# Patient Record
Sex: Female | Born: 1965 | Race: White | Hispanic: No | Marital: Married | Smoking: Current every day smoker
Health system: Southern US, Community
[De-identification: ages and names within clinical notes are randomized; demographics above are authoritative.]

## PROBLEM LIST (undated history)

## (undated) ENCOUNTER — Ambulatory Visit: Discharge status: Absent without leave | Payer: BC Managed Care – PPO

## (undated) DIAGNOSIS — B019 Varicella without complication: Secondary | ICD-10-CM

## (undated) HISTORY — DX: Varicella without complication: B01.9

## (undated) HISTORY — PX: FINGER SURGERY: SHX640

---
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
## 1998-10-15 ENCOUNTER — Encounter: Payer: Self-pay | Admitting: Emergency Medicine

## 1998-10-15 ENCOUNTER — Emergency Department (HOSPITAL_COMMUNITY): Admission: EM | Admit: 1998-10-15 | Discharge: 1998-10-15 | Payer: Self-pay | Admitting: Emergency Medicine

## 1999-07-25 ENCOUNTER — Other Ambulatory Visit: Admission: RE | Admit: 1999-07-25 | Discharge: 1999-07-25 | Payer: Self-pay | Admitting: Obstetrics and Gynecology

## 1999-07-26 ENCOUNTER — Encounter: Payer: Self-pay | Admitting: Emergency Medicine

## 1999-07-26 ENCOUNTER — Emergency Department (HOSPITAL_COMMUNITY): Admission: EM | Admit: 1999-07-26 | Discharge: 1999-07-26 | Payer: Self-pay | Admitting: Emergency Medicine

## 1999-07-31 ENCOUNTER — Ambulatory Visit (HOSPITAL_BASED_OUTPATIENT_CLINIC_OR_DEPARTMENT_OTHER): Admission: RE | Admit: 1999-07-31 | Discharge: 1999-07-31 | Payer: Self-pay | Admitting: Orthopedic Surgery

## 1999-11-29 ENCOUNTER — Ambulatory Visit (HOSPITAL_COMMUNITY)
Admission: RE | Admit: 1999-11-29 | Discharge: 1999-11-29 | Payer: No Typology Code available for payment source | Admitting: Obstetrics & Gynecology

## 2000-03-04 ENCOUNTER — Inpatient Hospital Stay (HOSPITAL_COMMUNITY): Admission: AD | Admit: 2000-03-04 | Discharge: 2000-03-07 | Payer: Self-pay | Admitting: Obstetrics and Gynecology

## 2001-10-13 ENCOUNTER — Other Ambulatory Visit: Admission: RE | Admit: 2001-10-13 | Discharge: 2001-10-13 | Payer: Self-pay | Admitting: Obstetrics and Gynecology

## 2003-04-19 ENCOUNTER — Other Ambulatory Visit: Admission: RE | Admit: 2003-04-19 | Discharge: 2003-04-19 | Payer: Self-pay | Admitting: Obstetrics and Gynecology

## 2004-08-01 ENCOUNTER — Other Ambulatory Visit: Admission: RE | Admit: 2004-08-01 | Discharge: 2004-08-01 | Payer: Self-pay | Admitting: Obstetrics and Gynecology

## 2005-10-27 ENCOUNTER — Other Ambulatory Visit: Admission: RE | Admit: 2005-10-27 | Discharge: 2005-10-27 | Payer: Self-pay | Admitting: Obstetrics and Gynecology

## 2009-07-11 ENCOUNTER — Emergency Department (HOSPITAL_COMMUNITY): Admission: EM | Admit: 2009-07-11 | Discharge: 2009-07-11 | Payer: Self-pay | Admitting: Emergency Medicine

## 2010-12-26 LAB — COMPREHENSIVE METABOLIC PANEL
ALT: 12 U/L (ref 0–35)
Alkaline Phosphatase: 57 U/L (ref 39–117)
CO2: 27 mEq/L (ref 19–32)
Calcium: 8.6 mg/dL (ref 8.4–10.5)
Chloride: 110 mEq/L (ref 96–112)
GFR calc non Af Amer: >60 mL/min (ref >60)
Glucose, Bld: 74 mg/dL (ref 70–99)
Potassium: 4.5 mEq/L (ref 3.5–5.1)
Sodium: 140 mEq/L (ref 135–145)
Total Bilirubin: 0.4 mg/dL (ref 0.3–1.2)

## 2010-12-26 LAB — LIPASE, BLOOD: Lipase: 29 U/L (ref 11–59)

## 2010-12-26 LAB — URINALYSIS, ROUTINE W REFLEX MICROSCOPIC
Bilirubin Urine: NEGATIVE
Glucose, UA: NEGATIVE mg/dL
Hgb urine dipstick: NEGATIVE
Ketones, ur: NEGATIVE mg/dL
Nitrite: NEGATIVE
Protein, ur: NEGATIVE mg/dL
Specific Gravity, Urine: 1.009 (ref 1.005–1.030)
Urobilinogen, UA: 0.2 mg/dL (ref 0.0–1.0)
pH: 7 (ref 5.0–8.0)

## 2010-12-26 LAB — DIFFERENTIAL
Basophils Relative: 1 % (ref 0–1)
Eosinophils Absolute: 0.1 10*3/uL (ref 0.0–0.7)
Neutrophils Relative %: 58 % (ref 43–77)

## 2010-12-26 LAB — CBC
Hemoglobin: 13 g/dL (ref 12.0–15.0)
MCHC: 34.8 g/dL (ref 30.0–36.0)
RBC: 4.01 MIL/uL (ref 3.87–5.11)

## 2010-12-26 LAB — PREGNANCY, URINE: Preg Test, Ur: NEGATIVE

## 2011-02-07 NOTE — Discharge Summary (Signed)
[facility]  Patient:    Diana Bruce, Diana Bruce                      MRN: [id number] Adm. Date:  [id number] Disc. Date: [id number] Attending:  Marcelle Overlie Dictator:   Leilani Able, P.A.                           Discharge Summary  FINAL DIAGNOSES:              1. Intrauterine pregnancy at 41-2/[redacted] weeks gestation.                               2. Nonreassuring fetal heart rate pattern.                               3. Inability of fetus to tolerate labor.                               4. Thick meconium stained fluid.  PROCEDURE:                    Primary low transverse cesarean section.  SURGEON:                      Marcelle Overlie, M.D.  COMPLICATIONS:                None.  HISTORY OF PRESENT ILLNESS:   This 45 year old, gravida 4, para 2, presents around 41-1/[redacted] weeks gestation complaining of rupture of membranes.  When the patient was put on the monitor, there were some variable decellerations noted. Amnioinfusion was begun secondary to the variables and the patient was of course admitted at his time. Thick meconium was noted with amnioinfusion and did not improve with amnioinfusion.  The patient still with some variables with late return. Tracing was discussed with the patient at bedside and decided to proceed with a cesarean section secondary to a persistent nonreassuring fetal heart rate tracing.  HOSPITAL COURSE:              The patient was taken to the operating room on March 05, 2000, by Marcelle Overlie, M.D. where a primary low transverse cesarean section was performed with the delivery of a 6 pound 2 ounce female infant with Apgars f 8 and 9.  The delivery went without complications.  The patients postoperative course was benign without significant fevers.  The patient was felt ready for discharge on postoperative day #2.  She was sent home on a regular diet, told to decrease activities, told to continue prenatal vitamins, and  FeSO4.  She was given Tylox one to two every four hours as needed for pain.  She was also given Motrin 600 mg one every six hours as needed for pain and told to follow up in the office in four weeks.  DISCHARGE LABORATORY DATA:    The patients hemoglobin was 10.1, white blood cell count 9.7. DD:  04/01/00 TD:  04/01/00 Job: [id number]

## 2011-02-07 NOTE — Op Note (Signed)
[facility]  Patient:    MARVENE, STROHM                      MRN: [id number] Proc. Date: 03/05/00 Adm. Date:  [id number] Attending:  Marcelle Overlie                           Operative Report  PREOPERATIVE DIAGNOSIS:       Intrauterine pregnancy at 41-2/7 weeks. Nonreassuring fetal heart rate pattern.  Inability of fetus to tolerate labor.  Thick meconium stained fluid.  POSTOPERATIVE DIAGNOSIS:      Intrauterine pregnancy at 41-2/7 weeks. Nonreassuring fetal heart rate pattern.  Inability of fetus to tolerate labor.  Thick meconium stained fluid.  OPERATION:                    Primary low transverse cesarean section.  SURGEON:                      Marcelle Overlie, M.D.  ASSISTANT:  ANESTHESIA:  ESTIMATED BLOOD LOSS:         500 cc.  FINDINGS:                     Female infant with thick meconium stained skin and fingernails, and placenta.  Apgars 8 at one minute and 9 at five minutes.  Body  cord x 1 with a nuchal cord x 1 and weight of 6 pounds 2 ounces.  COMPLICATIONS:                None.  DESCRIPTION OF PROCEDURE:     The patient was taken to the operating room and a  spinal was then placed.  A Foley was already placed in the bladder in the labor and delivery.  The abdomen was then prepped and draped in the usual sterile fashion.  A scalpel was used to make a small low transverse incision which was then carried  down to the fascia using electrocautery with good hemostasis.  The fascia was scored in the midline and extended laterally using Mayo scissors.  A Pfannenstiel incision was then created.  The rectus muscles were separated and the peritoneum was then entered and the incision was then extended.  The bladder blade was inserted and the lower uterine segment was identified.  The bladder flap was created sharply and digitally and then the bladder blade was then readjusted.  low transverse incision was made with the  scalpel and then extended laterally.  Upon entry into the uterine cavity, there was noted to be thick meconium.  The aby was in cephalic presentation and was delivered without difficulty.  It was noted that the baby was meconium stained and was a dark green color.  The babys mouth  and nose were DeLee suctioned on the perineum and was lightly meconium stained, but watery fluid which was obtained.  There was a loose nuchal cord x 1 as well as  loose body cord.  The cord was very thin as well.  The baby was delivered without difficulty.  The cord was clamped and cut.  The placenta was then manually removed and noted to be intact.  The uterus was cleared of all clots and debris.  The uterine incision was closed in a single layer using 0 chromic in a continuous running locked stitch.  This was noted to be hemostatic.  The peritoneum  was closed in a single layer using 0 Vicryl stitch in a continuous running stitch and the fascia was closed using 0 Vicryl in a continuous running stitch x 2 starting at  each corner and meeting in the midline.  After irrigation of the subcutaneous layers, the skin was closed with staples.  All sponge, needle, and instrument counts were correct x 2.  The patient tolerated the procedure well and went to he recovery room in stable condition. DD:  03/05/00 TD:  03/08/00 Job: [id number]

## 2014-02-24 ENCOUNTER — Ambulatory Visit (INDEPENDENT_AMBULATORY_CARE_PROVIDER_SITE_OTHER): Payer: No Typology Code available for payment source | Admitting: Internal Medicine

## 2014-02-24 ENCOUNTER — Encounter (INDEPENDENT_AMBULATORY_CARE_PROVIDER_SITE_OTHER): Payer: Self-pay

## 2014-02-24 ENCOUNTER — Encounter: Payer: Self-pay | Admitting: Internal Medicine

## 2014-02-24 ENCOUNTER — Other Ambulatory Visit (HOSPITAL_COMMUNITY)
Admission: RE | Admit: 2014-02-24 | Discharge: 2014-02-24 | Disposition: A | Discharge status: Home / Self Care | Payer: No Typology Code available for payment source | Source: Ambulatory Visit | Attending: Internal Medicine | Admitting: Internal Medicine

## 2014-02-24 VITALS — BP 98/64 | HR 78 | Temp 98.0°F | Ht 63.0 in | Wt 130.0 lb

## 2014-02-24 DIAGNOSIS — Z1239 Encounter for other screening for malignant neoplasm of breast: Secondary | ICD-10-CM | POA: Diagnosis not present

## 2014-02-24 DIAGNOSIS — Z0000 Encounter for general adult medical examination without abnormal findings: Secondary | ICD-10-CM | POA: Diagnosis not present

## 2014-02-24 DIAGNOSIS — B379 Candidiasis, unspecified: Secondary | ICD-10-CM

## 2014-02-24 DIAGNOSIS — Z01419 Encounter for gynecological examination (general) (routine) without abnormal findings: Secondary | ICD-10-CM | POA: Insufficient documentation

## 2014-02-24 DIAGNOSIS — L678 Other hair color and hair shaft abnormalities: Secondary | ICD-10-CM | POA: Diagnosis not present

## 2014-02-24 DIAGNOSIS — T3695XA Adverse effect of unspecified systemic antibiotic, initial encounter: Secondary | ICD-10-CM

## 2014-02-24 DIAGNOSIS — L738 Other specified follicular disorders: Secondary | ICD-10-CM

## 2014-02-24 DIAGNOSIS — Z1151 Encounter for screening for human papillomavirus (HPV): Secondary | ICD-10-CM | POA: Insufficient documentation

## 2014-02-24 DIAGNOSIS — Z124 Encounter for screening for malignant neoplasm of cervix: Secondary | ICD-10-CM

## 2014-02-24 DIAGNOSIS — L739 Follicular disorder, unspecified: Secondary | ICD-10-CM

## 2014-02-24 LAB — LIPID PANEL
CHOLESTEROL: 224 mg/dL — AB (ref 0–200)
HDL: 53.2 mg/dL (ref >39.00)
LDL Cholesterol: 152 mg/dL — ABNORMAL HIGH (ref 0–99)
NonHDL: 170.8
TRIGLYCERIDES: 94 mg/dL (ref 0.0–149.0)
Total CHOL/HDL Ratio: 4
VLDL: 18.8 mg/dL (ref 0.0–40.0)

## 2014-02-24 LAB — CBC
HEMATOCRIT: 38.8 % (ref 36.0–46.0)
HEMOGLOBIN: 13 g/dL (ref 12.0–15.0)
MCHC: 33.5 g/dL (ref 30.0–36.0)
MCV: 89.9 fl (ref 78.0–100.0)
PLATELETS: 258 10*3/uL (ref 150.0–400.0)
RBC: 4.31 Mil/uL (ref 3.87–5.11)
RDW: 12.8 % (ref 11.5–15.5)
WBC: 7.7 10*3/uL (ref 4.0–10.5)

## 2014-02-24 LAB — TSH: TSH: 0.7 u[IU]/mL (ref 0.35–4.50)

## 2014-02-24 LAB — COMPREHENSIVE METABOLIC PANEL
ALBUMIN: 4 g/dL (ref 3.5–5.2)
ALK PHOS: 49 U/L (ref 39–117)
ALT: 12 U/L (ref 0–35)
AST: 17 U/L (ref 0–37)
BUN: 9 mg/dL (ref 6–23)
CO2: 29 mEq/L (ref 19–32)
CREATININE: 0.6 mg/dL (ref 0.4–1.2)
Calcium: 9.2 mg/dL (ref 8.4–10.5)
Chloride: 100 mEq/L (ref 96–112)
GFR: 111.18 mL/min (ref >60.00)
GLUCOSE: 83 mg/dL (ref 70–99)
POTASSIUM: 3.9 meq/L (ref 3.5–5.1)
Sodium: 136 mEq/L (ref 135–145)
Total Bilirubin: 0.9 mg/dL (ref 0.2–1.2)
Total Protein: 7 g/dL (ref 6.0–8.3)

## 2014-02-24 MED ORDER — CEPHALEXIN 500 MG PO CAPS: 500.0000 mg | ORAL_CAPSULE | Freq: Four times a day (QID) | ORAL | Status: DC

## 2014-02-24 MED ORDER — FLUCONAZOLE 150 MG PO TABS: 150.0000 mg | ORAL_TABLET | Freq: Once | ORAL | Status: DC

## 2014-02-24 NOTE — Addendum Note (Signed)
Addended by: Lurlean Nanny on: 02/24/2014 12:04 PM   Modules accepted: Orders

## 2014-02-24 NOTE — Progress Notes (Signed)
HPI  Pt presents to the clinic today to establish care. She has not had a PCP in many years.  Flu: never Tetanus: more than 10 years Pap Smear: more than 5 years ago Mammogram: more than 5 years ago Eye Doctor: yearly Dentist: biannually  Past Medical History  Diagnosis Date  . Chicken pox     No current outpatient prescriptions on file.   No current facility-administered medications for this visit.    Allergies  Allergen Reactions  . Codeine Nausea And Vomiting    Family History  Problem Relation Age of Onset  . Alcohol abuse Father   . Stroke Father   . Heart disease Father   . Heart disease Maternal Uncle   . Heart disease Maternal Grandfather     History   Social History  . Marital Status: Single    Spouse Name: N/A    Number of Children: N/A  . Years of Education: N/A   Occupational History  . Not on file.   Social History Main Topics  . Smoking status: Current Some Day Smoker  . Smokeless tobacco: Not on file     Comment: e-cig  . Alcohol Use: Yes     Comment: rare--wine  . Drug Use: Not on file  . Sexual Activity: Not on file   Other Topics Concern  . Not on file   Social History Narrative  . No narrative on file    ROS:  Constitutional: Denies fever, malaise, fatigue, headache or abrupt weight changes.  HEENT: Denies eye pain, eye redness, ear pain, ringing in the ears, wax buildup, runny nose, nasal congestion, bloody nose, or sore throat. Respiratory: Denies difficulty breathing, shortness of breath, cough or sputum production.   Cardiovascular: Denies chest pain, chest tightness, palpitations or swelling in the hands or feet.  Gastrointestinal: Denies abdominal pain, bloating, constipation, diarrhea or blood in the stool.  GU: Denies frequency, urgency, pain with urination, blood in urine, odor or discharge. Musculoskeletal: Denies decrease in range of motion, difficulty with gait, muscle pain or joint pain and swelling.  Skin: Denies  redness, rashes, lesions or ulcercations.  Neurological: Denies dizziness, difficulty with memory, difficulty with speech or problems with balance and coordination.   No other specific complaints in a complete review of systems (except as listed in HPI above).  PE:  BP 98/64  Pulse 78  Temp(Src) 98 F (36.7 C) (Oral)  Ht _0  (1.6 m)  Wt 130 lb (58.968 kg)  BMI 23.03 kg/m2  SpO2 98% Wt Readings from Last 3 Encounters:  02/24/14 130 lb (58.968 kg)   Constitutional:  Alert, oriented x 4, well developed, well nourished in no apparent distress. Skin: Skin is warm and dry.  No erythema, lesion or ulceration noted. HEENT: Head: normal shape and size; Eyes: sclera white, no icterus, conjunctiva pink, PERRLA and EOMs intact; Ears: Tm's gray and intact, normal light reflex; Nose: mucosa pink and moist, septum midline; Throat/Mouth: Teeth present, , mucosa pink and moist, no lesions or ulcerations noted. Neck: Normal range of motion. Neck supple, trachea midline. No massses, lumps or thyromegaly present.  Cardiovascular: Normal rate and rhythm. S1,S2 noted.  No murmur, rubs or gallops noted. No JVD or BLE edema. No carotid bruits noted. Pulmonary/Chest: Normal effort and positive vesicular breath sounds. No respiratory distress. No wheezes, rales or ronchi noted.  Abdomen: Soft and nontender. Normal bowel sounds, no bruits noted. No distention or masses noted. Liver, spleen and kidneys non palpable. Genitourinary: Normal female anatomy.  Uterus midline, anterior and soft. No CMT or discharge noted. [place] non palpable. Breast without lumps or masses. Folliculitis noted on the around the vaginal area. Musculoskeletal: Normal range of motion. Patient exhibits no effusions.  Neurological: Alert and oriented. Cranial nerves II-XII intact. Coordination normal. +DTRs bilaterally. Psychiatric: She has a normal mood and affect. Behavior is normal. Judgment and thought content normal.    BMET     Component Value Date/Time   NA 140 07/11/2009 1045   K 4.5 07/11/2009 1045   CL 110 07/11/2009 1045   CO2 27 07/11/2009 1045   GLUCOSE 74 07/11/2009 1045   BUN 8 07/11/2009 1045   CREATININE 0.77 07/11/2009 1045   CALCIUM 8.6 07/11/2009 1045   GFRNONAA >60 07/11/2009 1045   GFRAA  Value: >60        The eGFR has been calculated using the MDRD equation. This calculation has not been validated in all clinical situations. eGFR's persistently <60 mL/min signify possible Chronic Kidney Disease. 07/11/2009 1045    Lipid Panel  No results found for this basename: chol, trig, hdl, cholhdl, vldl, ldlcalc    CBC    Component Value Date/Time   WBC 7.8 07/11/2009 1045   RBC 4.01 07/11/2009 1045   HGB 13.0 07/11/2009 1045   HCT 37.3 07/11/2009 1045   PLT 218 07/11/2009 1045   MCV 93.2 07/11/2009 1045   MCHC 34.8 07/11/2009 1045   RDW 12.8 07/11/2009 1045   LYMPHSABS 2.5 07/11/2009 1045   MONOABS 0.6 07/11/2009 1045   EOSABS 0.1 07/11/2009 1045   BASOSABS 0.1 07/11/2009 1045    Hgb A1C No results found for this basename: HGBA1C     Assessment and Plan:  Preventative Health Maintenance:  Encouraged her to stop using e cigs Will obtain basic screening labs today Tdap given today Will refer for mammogram   Folliculitis:  eRx for keflex QID x 5 days eRx for diflucan for antibiotic induced yeast infection  RTC in 1 year or sooner if needed

## 2014-02-24 NOTE — Progress Notes (Signed)
Pre visit review using our clinic review tool, if applicable. No additional management support is needed unless otherwise documented below in the visit note. 

## 2014-02-24 NOTE — Patient Instructions (Addendum)

## 2014-02-27 ENCOUNTER — Telehealth: Payer: Self-pay | Admitting: Internal Medicine

## 2014-02-27 LAB — CYTOLOGY - PAP

## 2014-02-27 NOTE — Telephone Encounter (Signed)
Relevant patient education mailed to patient.  

## 2014-08-01 ENCOUNTER — Ambulatory Visit (INDEPENDENT_AMBULATORY_CARE_PROVIDER_SITE_OTHER): Payer: No Typology Code available for payment source | Admitting: Internal Medicine

## 2014-08-01 ENCOUNTER — Encounter: Payer: Self-pay | Admitting: Internal Medicine

## 2014-08-01 VITALS — BP 100/64 | HR 65 | Temp 97.8°F | Wt 136.0 lb

## 2014-08-01 DIAGNOSIS — J028 Acute pharyngitis due to other specified organisms: Secondary | ICD-10-CM

## 2014-08-01 DIAGNOSIS — B3731 Acute candidiasis of vulva and vagina: Secondary | ICD-10-CM

## 2014-08-01 DIAGNOSIS — B9789 Other viral agents as the cause of diseases classified elsewhere: Secondary | ICD-10-CM

## 2014-08-01 DIAGNOSIS — Z23 Encounter for immunization: Secondary | ICD-10-CM

## 2014-08-01 DIAGNOSIS — R300 Dysuria: Secondary | ICD-10-CM

## 2014-08-01 DIAGNOSIS — J029 Acute pharyngitis, unspecified: Secondary | ICD-10-CM

## 2014-08-01 DIAGNOSIS — B373 Candidiasis of vulva and vagina: Secondary | ICD-10-CM

## 2014-08-01 MED ORDER — FLUCONAZOLE 150 MG PO TABS: 150.0000 mg | ORAL_TABLET | Freq: Once | ORAL | Status: DC

## 2014-08-01 NOTE — Patient Instructions (Signed)

## 2014-08-01 NOTE — Progress Notes (Signed)
Pre visit review using our clinic review tool, if applicable. No additional management support is needed unless otherwise documented below in the visit note. 

## 2014-08-01 NOTE — Progress Notes (Signed)
Subjective:    Patient ID: Diana Bruce, female    DOB: [birth year], 48 y.o.   MRN: [id number]  HPI  Pt presents to the clinic today with c/o cloudy urine. This started about 1 month ago. She denies urgency, frequency or dysuria. She had noticed some white vaginal discharge as well. She has had some vaginal itching and irritation. It does have a slight odor to it. She denies abnormal vaginal bleeding. She is perimenopausal. She has not had sexual contacts recently.  She also complains of sore throat and swollen glands. This started 2-3 days ago. She denies fever, chills or body aches. She has tried warm salt water gargles. She has not had sick contacts. She has no history of seasonal allergies. She does not smoke.   Review of Systems      Past Medical History  Diagnosis Date  . Chicken pox     No current outpatient prescriptions on file.   No current facility-administered medications for this visit.    Allergies  Allergen Reactions  . Codeine Nausea And Vomiting    Family History  Problem Relation Age of Onset  . Alcohol abuse Father   . Stroke Father   . Heart disease Father   . Heart disease Maternal Uncle   . Heart disease Maternal Grandfather   . Cancer Mother     bladder  . Diabetes Neg Hx     History   Social History  . Marital Status: Single    Spouse Name: N/A    Number of Children: N/A  . Years of Education: N/A   Occupational History  . Not on file.   Social History Main Topics  . Smoking status: Current Some Day Smoker    Types: E-cigarettes  . Smokeless tobacco: Not on file     Comment: e-cig  . Alcohol Use: 0.6 oz/week    1 Glasses of wine per week     Comment: rare--wine  . Drug Use: No  . Sexual Activity: Not Currently   Other Topics Concern  . Not on file   Social History Narrative     Constitutional: Denies fever, malaise, fatigue, headache or abrupt weight changes.  HEENT: Pt reports sore throat. Denies eye pain, eye  redness, ear pain, ringing in the ears, wax buildup, runny nose, nasal congestion, bloody nose. Respiratory: Denies difficulty breathing, shortness of breath, cough or sputum production.   GU: Pt report cloudy urine, vaginal discharge, odor and irritation. Denies urgency, frequency, pain with urination, burning sensation, blood in urine.   No other specific complaints in a complete review of systems (except as listed in HPI above).  Objective:   Physical Exam   BP 100/64 mmHg  Pulse 65  Temp(Src) 97.8 F (36.6 C) (Oral)  Wt 136 lb (61.689 kg)  SpO2 99% Wt Readings from Last 3 Encounters:  08/01/14 136 lb (61.689 kg)  02/24/14 130 lb (58.968 kg)    General: Appears her stated age, well developed, well nourished in NAD. HEENT: Head: normal shape and size; Ears: Tm's gray and intact, normal light reflex; Nose: mucosa pink and moist, septum midline; Throat/Mouth: Teeth present, mucosa erythematous and moist, no exudate noted. Small ulcerations noted on bilateral tonsillar pillars. Cardiovascular: Normal rate and rhythm. S1,S2 noted.  No murmur, rubs or gallops noted.  Pulmonary/Chest: Normal effort and positive vesicular breath sounds. No respiratory distress. No wheezes, rales or ronchi noted.  Abdomen: Soft and nontender. Normal bowel sounds, no bruits noted. No distention or  masses noted. Liver, spleen and kidneys non palpable. Pelvic: Irritation of the skin noted in bilateral groins. Normal female anatomy. Small amount of thin white discharge noted in vaginal opening.  BMET    Component Value Date/Time   NA 136 02/24/2014 1141   K 3.9 02/24/2014 1141   CL 100 02/24/2014 1141   CO2 29 02/24/2014 1141   GLUCOSE 83 02/24/2014 1141   BUN 9 02/24/2014 1141   CREATININE 0.6 02/24/2014 1141   CALCIUM 9.2 02/24/2014 1141   GFRNONAA >60 07/11/2009 1045   GFRAA  07/11/2009 1045    >60        The eGFR has been calculated using the MDRD equation. This calculation has not  been validated in all clinical situations. eGFR's persistently <60 mL/min signify possible Chronic Kidney Disease.    Lipid Panel     Component Value Date/Time   CHOL 224* 02/24/2014 1141   TRIG 94.0 02/24/2014 1141   HDL 53.20 02/24/2014 1141   CHOLHDL 4 02/24/2014 1141   VLDL 18.8 02/24/2014 1141   LDLCALC 152* 02/24/2014 1141    CBC    Component Value Date/Time   WBC 7.7 02/24/2014 1141   RBC 4.31 02/24/2014 1141   HGB 13.0 02/24/2014 1141   HCT 38.8 02/24/2014 1141   PLT 258.0 02/24/2014 1141   MCV 89.9 02/24/2014 1141   MCHC 33.5 02/24/2014 1141   RDW 12.8 02/24/2014 1141   LYMPHSABS 2.5 07/11/2009 1045   MONOABS 0.6 07/11/2009 1045   EOSABS 0.1 07/11/2009 1045   BASOSABS 0.1 07/11/2009 1045    Hgb A1C No results found for: HGBA1C      Assessment & Plan:   Sore throat:  Likely viral RST- negative Continue salt water gargles Try Ibuprofen for discomfort  Candida Vaginitis:  Wet prep: + yeast eRx for Diflucan 150 mg PO x 1  RTC as needed or if symptoms persist or worsen

## 2014-08-02 LAB — POCT URINALYSIS DIPSTICK
BILIRUBIN UA: NEGATIVE
GLUCOSE UA: NEGATIVE
Ketones, UA: NEGATIVE
NITRITE UA: NEGATIVE
Protein, UA: NEGATIVE
RBC UA: NEGATIVE
Spec Grav, UA: 1.02
Urobilinogen, UA: NEGATIVE
pH, UA: 6

## 2014-08-02 LAB — POCT RAPID STREP A (OFFICE): Rapid Strep A Screen: NEGATIVE

## 2014-08-02 NOTE — Addendum Note (Signed)
Addended by: Lurlean Nanny on: 08/02/2014 04:24 PM   Modules accepted: Orders

## 2014-08-11 ENCOUNTER — Telehealth: Payer: Self-pay | Admitting: Internal Medicine

## 2014-08-11 NOTE — Telephone Encounter (Signed)
emmi emailed °

## 2016-11-01 ENCOUNTER — Encounter: Payer: Self-pay | Admitting: Family Medicine

## 2016-11-01 ENCOUNTER — Ambulatory Visit (INDEPENDENT_AMBULATORY_CARE_PROVIDER_SITE_OTHER): Payer: BC Managed Care – PPO | Admitting: Family Medicine

## 2016-11-01 DIAGNOSIS — B349 Viral infection, unspecified: Secondary | ICD-10-CM

## 2016-11-01 DIAGNOSIS — R6889 Other general symptoms and signs: Secondary | ICD-10-CM

## 2016-11-01 LAB — POCT INFLUENZA A/B
INFLUENZA B, POC: NEGATIVE
Influenza A, POC: NEGATIVE

## 2016-11-01 NOTE — Progress Notes (Signed)
Sx started about two days ago.  Sick contact with similar sx and ST.  Now patient started with ST, then nasal congestion, fatigue, some shoulder aches, some leg aches.  No fevers.  No vomiting.  No diarrhea.  Minimal cough, mainly throat clearing.    Meds, vitals, and allergies reviewed.   ROS: Per HPI unless specifically indicated in ROS section   GEN: nad, alert and oriented HEENT: mucous membranes moist, tm w/o erythema, nasal exam w/o erythema, clear discharge noted,  OP with cobblestoning NECK: supple w/o LA CV: rrr.   PULM: ctab, no inc wob EXT: no edema  Flu test done at [facility], neg.  D/w pt.

## 2016-11-01 NOTE — Patient Instructions (Addendum)
Drink plenty of fluids, take tylenol as needed, and gargle with warm salt water for your throat.  This should gradually improve.  Take care.  Let us know if you have other concerns.    

## 2016-11-01 NOTE — Progress Notes (Signed)
Pre visit review using our clinic review tool, if applicable. No additional management support is needed unless otherwise documented below in the visit note. 

## 2016-11-01 NOTE — Assessment & Plan Note (Addendum)
See flu test results, d/w pt at [place].  Nontoxic.  Supportive care.  D/w pt. Okay for outpatient f/u.   She agrees.   Likely benign viral process.

## 2017-04-13 ENCOUNTER — Ambulatory Visit (INDEPENDENT_AMBULATORY_CARE_PROVIDER_SITE_OTHER): Payer: BC Managed Care – PPO | Admitting: Podiatry

## 2017-04-13 ENCOUNTER — Encounter: Payer: Self-pay | Admitting: Podiatry

## 2017-04-13 VITALS — BP 117/72 | HR 88 | Resp 16

## 2017-04-13 DIAGNOSIS — L600 Ingrowing nail: Secondary | ICD-10-CM

## 2017-04-13 MED ORDER — NEOMYCIN-POLYMYXIN-HC 1 % OT SOLN: OTIC | 1 refills | Status: DC

## 2017-04-13 NOTE — Patient Instructions (Signed)

## 2017-04-13 NOTE — Progress Notes (Signed)
   Subjective:    Patient ID: Diana Bruce, female    DOB: Jul 08, 1966, 51 y.o.   MRN: [id number]  HPI: She presents today chief complaint of painful border to the hallux nail right. She states that it's been bothering me intermittently over the past several years and states that I had to have it cut out 3 times and she refers to the tibial border hallux right. She states there was just a simple cut out and it regrew. She states that she would like to have something more permanently performed.  Review of Systems  Cardiovascular: Positive for palpitations.  All other systems reviewed and are negative.      Objective:   Physical Exam: Vital signs are stable she is alert and oriented 3. Pulses are palpable. Normal neurologic sensorium. Deep tendon reflexes are intact muscle strength is normal bilateral symmetrical area cutaneous evaluation shows supple well-hydrated cutis sharper aerated nail margin tibial border hallux right mild erythema is present. Tenderness is present with palpation.          Assessment & Plan:  Assessment: Ingrown nail paronychia Says hallux right.  Plan: Chemical matricectomy is performed today after local anesthesia was measured. Tolerated procedure well without complications. She was provided by the ligament him go instructions for care and soaking of her toe as well as a prescription for Cortisporin Otic to be applied twice daily episodes I'll follow-up with her in 1-2 weeks.

## 2017-04-27 ENCOUNTER — Ambulatory Visit: Payer: BC Managed Care – PPO | Admitting: Podiatry

## 2017-11-23 ENCOUNTER — Encounter: Payer: BC Managed Care – PPO | Admitting: Internal Medicine

## 2018-12-01 ENCOUNTER — Other Ambulatory Visit: Payer: Self-pay

## 2018-12-01 ENCOUNTER — Encounter: Payer: Self-pay | Admitting: Internal Medicine

## 2018-12-01 ENCOUNTER — Ambulatory Visit: Payer: BC Managed Care – PPO | Admitting: Internal Medicine

## 2018-12-01 VITALS — BP 114/72 | HR 88 | Temp 97.8°F | Wt 132.0 lb

## 2018-12-01 DIAGNOSIS — R0781 Pleurodynia: Secondary | ICD-10-CM | POA: Diagnosis not present

## 2018-12-01 DIAGNOSIS — R140 Abdominal distension (gaseous): Secondary | ICD-10-CM

## 2018-12-01 MED ORDER — METHOCARBAMOL 500 MG PO TABS: 500.0000 mg | ORAL_TABLET | Freq: Every day | ORAL | 0 refills | Status: DC

## 2018-12-01 NOTE — Progress Notes (Signed)
Subjective:    Patient ID: Diana Bruce, female    DOB: 1966/03/17, 53 y.o.   MRN: [id number]  HPI  Pt presents to the clinic today with c/o bilateral rib pain. She reports this started 2-3 weeks ago. She describes the pain as tender, sore and cramping. The pain radiates into her upper stomach and her upper back. The pain is worse with sitting.  She reports associated bloating but denies nausea, vomiting, diarrhea, constipation or blood in her stool. She denies cough, chest pain or shortness of breath. She denies any injury to the area. She has tried Ibuprofen with minimal relief.  Review of Systems      Past Medical History:  Diagnosis Date  . Chicken pox     Current Outpatient Medications  Medication Sig Dispense Refill  . NEOMYCIN-POLYMYXIN-HYDROCORTISONE (CORTISPORIN) 1 % SOLN OTIC solution Apply 1-2 drops to toe BID after soaking 10 mL 1   No current facility-administered medications for this visit.     Allergies  Allergen Reactions  . Codeine Nausea And Vomiting    Family History  Problem Relation Age of Onset  . Alcohol abuse Father   . Stroke Father   . Heart disease Father   . Heart disease Maternal Uncle   . Heart disease Maternal Grandfather   . Cancer Mother        bladder  . Diabetes Neg Hx     Social History   Socioeconomic History  . Marital status: Single    Spouse name: Not on file  . Number of children: Not on file  . Years of education: Not on file  . Highest education level: Not on file  Occupational History  . Not on file  Social Needs  . Financial resource strain: Not on file  . Food insecurity:    Worry: Not on file    Inability: Not on file  . Transportation needs:    Medical: Not on file    Non-medical: Not on file  Tobacco Use  . Smoking status: Current Some Day Smoker    Types: E-cigarettes  . Smokeless tobacco: Never Used  . Tobacco comment: e-cig  Substance and Sexual Activity  . Alcohol use: Yes    Alcohol/week: 1.0  standard drinks    Types: 1 Glasses of wine per week    Comment: rare--wine  . Drug use: No  . Sexual activity: Not Currently  Lifestyle  . Physical activity:    Days per week: Not on file    Minutes per session: Not on file  . Stress: Not on file  Relationships  . Social connections:    Talks on phone: Not on file    Gets together: Not on file    Attends religious service: Not on file    Active member of club or organization: Not on file    Attends meetings of clubs or organizations: Not on file    Relationship status: Not on file  . Intimate partner violence:    Fear of current or ex partner: Not on file    Emotionally abused: Not on file    Physically abused: Not on file    Forced sexual activity: Not on file  Other Topics Concern  . Not on file  Social History Narrative  . Not on file     Constitutional: Denies fever, malaise, fatigue, headache or abrupt weight changes.  HEENT: Denies eye pain, eye redness, ear pain, ringing in the ears, wax buildup, runny nose, nasal  congestion, bloody nose, or sore throat. Respiratory: Denies difficulty breathing, shortness of breath, cough or sputum production.   Cardiovascular: Denies chest pain, chest tightness, palpitations or swelling in the hands or feet.  Gastrointestinal: Pt reports bloating. Denies abdominal pain, constipation, diarrhea or blood in the stool.  GU: Denies urgency, frequency, pain with urination, burning sensation, blood in urine, odor or discharge. Musculoskeletal: Pt reports rib pain. Denies decrease in range of motion, difficulty with gait, or joint swelling.  Skin: Denies redness, rashes, lesions or ulcercations.   No other specific complaints in a complete review of systems (except as listed in HPI above).  Objective:   Physical Exam   BP 114/72   Pulse 88   Temp 97.8 F (36.6 C) (Oral)   Wt 132 lb (59.9 kg)   SpO2 98%   BMI 23.38 kg/m  Wt Readings from Last 3 Encounters:  12/01/18 132 lb (59.9  kg)  11/01/16 139 lb 1.3 oz (63.1 kg)  08/01/14 136 lb (61.7 kg)    General: Appears her stated age, well developed, well nourished in NAD. Skin: Warm, dry and intact. No rashes noted. Cardiovascular: Normal rate and rhythm.  Pulmonary/Chest: Normal effort and positive vesicular breath sounds. No respiratory distress. No wheezes, rales or ronchi noted.  Abdomen: Soft and nontender. Normal bowel sounds. No distention or masses noted.  Musculoskeletal: Normal range of motion. Ribs non tender with palpation.  Neurological: Alert and oriented.    BMET    Component Value Date/Time   NA 136 02/24/2014 1141   K 3.9 02/24/2014 1141   CL 100 02/24/2014 1141   CO2 29 02/24/2014 1141   GLUCOSE 83 02/24/2014 1141   BUN 9 02/24/2014 1141   CREATININE 0.6 02/24/2014 1141   CALCIUM 9.2 02/24/2014 1141   GFRNONAA >60 07/11/2009 1045   GFRAA  07/11/2009 1045    >60        The eGFR has been calculated using the MDRD equation. This calculation has not been validated in all clinical situations. eGFR's persistently <60 mL/min signify possible Chronic Kidney Disease.    Lipid Panel     Component Value Date/Time   CHOL 224 (H) 02/24/2014 1141   TRIG 94.0 02/24/2014 1141   HDL 53.20 02/24/2014 1141   CHOLHDL 4 02/24/2014 1141   VLDL 18.8 02/24/2014 1141   LDLCALC 152 (H) 02/24/2014 1141    CBC    Component Value Date/Time   WBC 7.7 02/24/2014 1141   RBC 4.31 02/24/2014 1141   HGB 13.0 02/24/2014 1141   HCT 38.8 02/24/2014 1141   PLT 258.0 02/24/2014 1141   MCV 89.9 02/24/2014 1141   MCHC 33.5 02/24/2014 1141   RDW 12.8 02/24/2014 1141   LYMPHSABS 2.5 07/11/2009 1045   MONOABS 0.6 07/11/2009 1045   EOSABS 0.1 07/11/2009 1045   BASOSABS 0.1 07/11/2009 1045    Hgb A1C No results found for: HGBA1C         Assessment & Plan:   Bilateral Rib Pain, Bloating:  Exam benign ? Poor posture/MSK in origin Continue Ibuprofen Encouraged better posture while driving  Encouraged stretching RX for Methocarbamol 500 mg QHS prn- sedation caution given  Return precautions discussed Webb Silversmith, NP

## 2018-12-01 NOTE — Patient Instructions (Signed)
Dolor en la pared torcica Chest Wall Pain El dolor en la pared torcica se produce en los huesos y los msculos del pecho o alrededor de Orthoptist. Las causas del dolor en la pared torcica pueden ser las siguientes:  Una lesin.  Mucha tos.  Usar [employer] del pecho y de [place]. En ocasiones, la causa puede ser desconocida. Este dolor puede tardar varias semanas o ms en mejorar. Siga estas indicaciones en su casa: Control del dolor, la rigidez y la hinchazn Si se lo indican, aplique hielo sobre la zona dolorida:  Ponga el hielo en una bolsa plstica.  Coloque una toalla entre la piel y Therapist, nutritional.  Coloque el hielo durante 20minutos, 2 a 3veces por Training and development officer.  Actividad  Haga reposo como se lo haya indicado el mdico.  Evite hacer cosas que le causen dolor. Esto incluye levantar objetos pesados.  Pregntele al mdico qu actividades son seguras para usted. Indicaciones generales   [employer] de venta libre y los recetados solamente como se lo haya indicado el mdico.  No consuma ningn producto que contenga nicotina o tabaco, como cigarrillos, cigarrillos electrnicos y tabaco de Higher education careers adviser. Si necesita ayuda para dejar de fumar, consulte al mdico.  Concurra a todas las visitas de seguimiento como se lo haya indicado el mdico. Esto es importante. Comunquese con un mdico si:  Tiene fiebre.  El dolor en el pecho [place].  Aparecen nuevos sntomas. Solicite ayuda inmediatamente si:  Airline pilot (nuseas) o vomita.  Philbert Riser o tiene sensacin de desvanecimiento.  Tiene tos con mucosidad de los pulmones (esputo) o expectora sangre al toser.  Le falta el aire. Estos sntomas pueden Sales executive. No espere a ver si los sntomas desaparecen. Solicite atencin mdica de inmediato. Comunquese con el servicio de emergencias de su localidad (911 en los [place]). No conduzca por sus propios medios [employer]. Resumen   El dolor en la pared torcica se produce en los huesos y los msculos del pecho o alrededor de Orthoptist.  Puede tratarse con hielo, reposo y medicamentos. La afeccin tambin puede mejorar si evita hacer cosas que le causan dolor.  Comunquese con un mdico si tiene fiebre, dolor en el pecho que empeora o sntomas nuevos.  Pida ayuda de inmediato si siente que se va a desvanecer o le falta el aire. Estos sntomas pueden Sales executive. Esta informacin no tiene Marine scientist el consejo del mdico. Asegrese de hacerle al mdico cualquier pregunta que tenga. Document Released: 08/28/2011 Document Revised: 05/07/2018 Document Reviewed: 05/07/2018 Elsevier Interactive Patient Education  [employer].

## 2019-04-18 ENCOUNTER — Other Ambulatory Visit: Payer: Self-pay | Admitting: Internal Medicine

## 2019-04-18 DIAGNOSIS — Z1231 Encounter for screening mammogram for malignant neoplasm of breast: Secondary | ICD-10-CM

## 2019-05-03 ENCOUNTER — Other Ambulatory Visit: Payer: Self-pay

## 2019-05-03 ENCOUNTER — Ambulatory Visit (INDEPENDENT_AMBULATORY_CARE_PROVIDER_SITE_OTHER): Payer: BC Managed Care – PPO | Admitting: Internal Medicine

## 2019-05-03 ENCOUNTER — Other Ambulatory Visit (HOSPITAL_COMMUNITY)
Admission: RE | Admit: 2019-05-03 | Discharge: 2019-05-03 | Disposition: A | Discharge status: Home / Self Care | Payer: BC Managed Care – PPO | Source: Ambulatory Visit | Attending: Internal Medicine | Admitting: Internal Medicine

## 2019-05-03 ENCOUNTER — Encounter: Payer: Self-pay | Admitting: Internal Medicine

## 2019-05-03 VITALS — BP 102/70 | HR 88 | Temp 98.1°F | Ht 63.0 in | Wt 136.4 lb

## 2019-05-03 DIAGNOSIS — E7800 Pure hypercholesterolemia, unspecified: Secondary | ICD-10-CM | POA: Diagnosis not present

## 2019-05-03 DIAGNOSIS — Z124 Encounter for screening for malignant neoplasm of cervix: Secondary | ICD-10-CM

## 2019-05-03 DIAGNOSIS — Z23 Encounter for immunization: Secondary | ICD-10-CM

## 2019-05-03 DIAGNOSIS — Z780 Asymptomatic menopausal state: Secondary | ICD-10-CM | POA: Diagnosis not present

## 2019-05-03 DIAGNOSIS — Z0000 Encounter for general adult medical examination without abnormal findings: Secondary | ICD-10-CM | POA: Diagnosis not present

## 2019-05-03 DIAGNOSIS — E559 Vitamin D deficiency, unspecified: Secondary | ICD-10-CM

## 2019-05-03 LAB — LIPID PANEL
Cholesterol: 303 mg/dL — ABNORMAL HIGH (ref 0–200)
HDL: 45 mg/dL (ref >39.00)
LDL Cholesterol: 235 mg/dL — ABNORMAL HIGH (ref 0–99)
NonHDL: 257.89
Total CHOL/HDL Ratio: 7
Triglycerides: 112 mg/dL (ref 0.0–149.0)
VLDL: 22.4 mg/dL (ref 0.0–40.0)

## 2019-05-03 LAB — COMPREHENSIVE METABOLIC PANEL
ALT: 15 U/L (ref 0–35)
AST: 17 U/L (ref 0–37)
Albumin: 4.3 g/dL (ref 3.5–5.2)
Alkaline Phosphatase: 105 U/L (ref 39–117)
BUN: 12 mg/dL (ref 6–23)
CO2: 27 mEq/L (ref 19–32)
Calcium: 9.5 mg/dL (ref 8.4–10.5)
Chloride: 104 mEq/L (ref 96–112)
Creatinine, Ser: 0.71 mg/dL (ref 0.40–1.20)
GFR: 85.99 mL/min (ref >60.00)
Glucose, Bld: 89 mg/dL (ref 70–99)
Potassium: 4.4 mEq/L (ref 3.5–5.1)
Sodium: 139 mEq/L (ref 135–145)
Total Bilirubin: 0.6 mg/dL (ref 0.2–1.2)
Total Protein: 6.8 g/dL (ref 6.0–8.3)

## 2019-05-03 LAB — VITAMIN D 25 HYDROXY (VIT D DEFICIENCY, FRACTURES): VITD: 23.4 ng/mL — ABNORMAL LOW (ref 30.00–100.00)

## 2019-05-03 LAB — CBC
HCT: 42.2 % (ref 36.0–46.0)
Hemoglobin: 14.4 g/dL (ref 12.0–15.0)
MCHC: 34.1 g/dL (ref 30.0–36.0)
MCV: 89.4 fl (ref 78.0–100.0)
Platelets: 269 10*3/uL (ref 150.0–400.0)
RBC: 4.71 Mil/uL (ref 3.87–5.11)
RDW: 13.3 % (ref 11.5–15.5)
WBC: 8.7 10*3/uL (ref 4.0–10.5)

## 2019-05-03 MED ORDER — PREMARIN 0.625 MG/GM VA CREA: 1.0000 | TOPICAL_CREAM | VAGINAL | 12 refills | Status: DC

## 2019-05-03 NOTE — Progress Notes (Signed)
Subjective:    Patient ID: Diana Bruce, female    DOB: Jun 05, 1966, 53 y.o.   MRN: [id number]  HPI  Pt presents to the clinic today for her annual exam.   Flu: 06/2018 Tetanus: unsure Pap Smear: 02/2014 Mammogram:  Scheduled, 05/20/2019 Colon Screening: never Vision Screening: yearly Dentist: as needed  Diet: She does eat meat. She consumes fruits and veggies daily. She does eat some fried foods. She drinks mostly coffee, Cheerwine, Green Tea and water. Exercise: None  Review of Systems      Past Medical History:  Diagnosis Date  . Chicken pox     No current outpatient medications on file.   No current facility-administered medications for this visit.     Allergies  Allergen Reactions  . Codeine Nausea And Vomiting    Family History  Problem Relation Age of Onset  . Alcohol abuse Father   . Stroke Father   . Heart disease Father   . Heart disease Maternal Uncle   . Heart disease Maternal Grandfather   . Cancer Mother        bladder  . Diabetes Neg Hx     Social History   Socioeconomic History  . Marital status: Single    Spouse name: Not on file  . Number of children: Not on file  . Years of education: Not on file  . Highest education level: Not on file  Occupational History  . Not on file  Social Needs  . Financial resource strain: Not on file  . Food insecurity    Worry: Not on file    Inability: Not on file  . Transportation needs    Medical: Not on file    Non-medical: Not on file  Tobacco Use  . Smoking status: Current Some Day Smoker    Types: E-cigarettes  . Smokeless tobacco: Never Used  . Tobacco comment: e-cig  Substance and Sexual Activity  . Alcohol use: Yes    Alcohol/week: 1.0 standard drinks    Types: 1 Glasses of wine per week    Comment: rare--wine  . Drug use: No  . Sexual activity: Not Currently  Lifestyle  . Physical activity    Days per week: Not on file    Minutes per session: Not on file  . Stress: Not on file   Relationships  . Social Herbalist on phone: Not on file    Gets together: Not on file    Attends religious service: Not on file    Active member of club or organization: Not on file    Attends meetings of clubs or organizations: Not on file    Relationship status: Not on file  . Intimate partner violence    Fear of current or ex partner: Not on file    Emotionally abused: Not on file    Physically abused: Not on file    Forced sexual activity: Not on file  Other Topics Concern  . Not on file  Social History Narrative  . Not on file     Constitutional: Denies fever, malaise, fatigue, headache or abrupt weight changes.  HEENT: Pt reports decreased hearing. Denies eye pain, eye redness, ear pain, ringing in the ears, wax buildup, runny nose, nasal congestion, bloody nose, or sore throat. Respiratory: Denies difficulty breathing, shortness of breath, cough or sputum production.   Cardiovascular: Denies chest pain, chest tightness, palpitations or swelling in the hands or feet.  Gastrointestinal: Denies abdominal pain, bloating, constipation,  diarrhea or blood in the stool.  GU: Pt reports dyspareunia. Denies urgency, frequency, pain with urination, burning sensation, blood in urine, odor or discharge. Musculoskeletal: Denies decrease in range of motion, difficulty with gait, muscle pain or joint pain and swelling.  Skin: Denies redness, rashes, lesions or ulcercations.  Neurological: Denies dizziness, difficulty with memory, difficulty with speech or problems with balance and coordination.  Psych: Denies anxiety, depression, SI/HI.  No other specific complaints in a complete review of systems (except as listed in HPI above).  Objective:   Physical Exam   BP 102/70 (BP Location: Left Arm, Patient Position: Sitting, Cuff Size: Normal)   Pulse 88   Temp 98.1 F (36.7 C) (Temporal)   Ht '5\' 3"'  (1.6 m)   Wt 136 lb 6.4 oz (61.9 kg)   SpO2 94%   BMI 24.16 kg/m  Wt  Readings from Last 3 Encounters:  05/03/19 136 lb 6.4 oz (61.9 kg)  12/01/18 132 lb (59.9 kg)  11/01/16 139 lb 1.3 oz (63.1 kg)    General: Appears her stated age, well developed, well nourished in NAD. Skin: Warm, dry and intact. Round slightly raised skin lesion of nose. HEENT: Head: normal shape and size; Eyes: sclera white, no icterus, conjunctiva pink, PERRLA and EOMs intact; Ears: Tm's gray and intact, normal light reflex;  Neck:  Neck supple, trachea midline. No masses, lumps or thyromegaly present.  Cardiovascular: Normal rate and rhythm. S1,S2 noted.  No murmur, rubs or gallops noted. No JVD or BLE edema. No carotid bruits noted. Pulmonary/Chest: Normal effort and positive vesicular breath sounds. No respiratory distress. No wheezes, rales or ronchi noted.  Abdomen: Soft and nontender. Normal bowel sounds. No distention or masses noted. Liver, spleen and kidneys non palpable. Musculoskeletal: Strength 5/5 BUE/BLE. No difficulty with gait.  Neurological: Alert and oriented. Cranial nerves II-XII grossly intact. Coordination normal.  Psychiatric: Mood and affect normal. Behavior is normal. Judgment and thought content normal.     BMET    Component Value Date/Time   NA 136 02/24/2014 1141   K 3.9 02/24/2014 1141   CL 100 02/24/2014 1141   CO2 29 02/24/2014 1141   GLUCOSE 83 02/24/2014 1141   BUN 9 02/24/2014 1141   CREATININE 0.6 02/24/2014 1141   CALCIUM 9.2 02/24/2014 1141   GFRNONAA >60 07/11/2009 1045   GFRAA  07/11/2009 1045    >60        The eGFR has been calculated using the MDRD equation. This calculation has not been validated in all clinical situations. eGFR's persistently <60 mL/min signify possible Chronic Kidney Disease.    Lipid Panel     Component Value Date/Time   CHOL 224 (H) 02/24/2014 1141   TRIG 94.0 02/24/2014 1141   HDL 53.20 02/24/2014 1141   CHOLHDL 4 02/24/2014 1141   VLDL 18.8 02/24/2014 1141   LDLCALC 152 (H) 02/24/2014 1141     CBC    Component Value Date/Time   WBC 7.7 02/24/2014 1141   RBC 4.31 02/24/2014 1141   HGB 13.0 02/24/2014 1141   HCT 38.8 02/24/2014 1141   PLT 258.0 02/24/2014 1141   MCV 89.9 02/24/2014 1141   MCHC 33.5 02/24/2014 1141   RDW 12.8 02/24/2014 1141   LYMPHSABS 2.5 07/11/2009 1045   MONOABS 0.6 07/11/2009 1045   EOSABS 0.1 07/11/2009 1045   BASOSABS 0.1 07/11/2009 1045    Hgb A1C No results found for: HGBA1C         Assessment & Plan:  Preventative Health Maintenance:  Encouraged her to get a flu shot in the fall Tdap today Pap smear today, she declines STD screening Mammogram scheduled She declines colonoscopy but is agreeable to IFOB Encouraged her to consume a balanced diet and exercise regimen Advised her to see an eye doctor and dentist annually Will check CBC, CMET, Lipid, and Vit D  Postmenopausal Vaginal Atrophy:  RX for Premarin Cream 3 x week Use water based lubricant  RTC in 1 year, sooner if needed Webb Silversmith, NP

## 2019-05-03 NOTE — Patient Instructions (Signed)

## 2019-05-04 ENCOUNTER — Encounter: Payer: Self-pay | Admitting: Internal Medicine

## 2019-05-04 MED ORDER — ROSUVASTATIN CALCIUM 10 MG PO TABS: 10.0000 mg | ORAL_TABLET | Freq: Every day | ORAL | 2 refills | Status: DC

## 2019-05-04 NOTE — Addendum Note (Signed)
Addended by: Lurlean Nanny on: 05/04/2019 04:56 PM   Modules accepted: Orders

## 2019-05-06 LAB — CYTOLOGY - PAP
Diagnosis: NEGATIVE
HPV: NOT DETECTED

## 2019-05-10 ENCOUNTER — Encounter: Payer: Self-pay | Admitting: Internal Medicine

## 2019-05-20 ENCOUNTER — Ambulatory Visit
Admission: RE | Admit: 2019-05-20 | Discharge: 2019-05-20 | Disposition: A | Discharge status: Home / Self Care | Payer: BC Managed Care – PPO | Source: Ambulatory Visit | Attending: Internal Medicine | Admitting: Internal Medicine

## 2019-05-20 DIAGNOSIS — Z1231 Encounter for screening mammogram for malignant neoplasm of breast: Secondary | ICD-10-CM | POA: Insufficient documentation

## 2019-07-26 ENCOUNTER — Other Ambulatory Visit: Payer: Self-pay | Admitting: Internal Medicine

## 2019-07-27 ENCOUNTER — Encounter: Payer: Self-pay | Admitting: Internal Medicine

## 2019-08-01 ENCOUNTER — Telehealth: Payer: Self-pay

## 2019-08-01 NOTE — Telephone Encounter (Signed)
LVM to call clinic, pt needs COVID screen and back lab info 11.9.2020 TLJ

## 2019-08-03 ENCOUNTER — Other Ambulatory Visit (INDEPENDENT_AMBULATORY_CARE_PROVIDER_SITE_OTHER): Payer: BC Managed Care – PPO

## 2019-08-03 DIAGNOSIS — E7800 Pure hypercholesterolemia, unspecified: Secondary | ICD-10-CM

## 2019-08-03 DIAGNOSIS — E559 Vitamin D deficiency, unspecified: Secondary | ICD-10-CM | POA: Diagnosis not present

## 2019-08-03 LAB — COMPREHENSIVE METABOLIC PANEL
ALT: 17 U/L (ref 0–35)
AST: 17 U/L (ref 0–37)
Albumin: 4 g/dL (ref 3.5–5.2)
Alkaline Phosphatase: 95 U/L (ref 39–117)
BUN: 12 mg/dL (ref 6–23)
CO2: 31 mEq/L (ref 19–32)
Calcium: 9 mg/dL (ref 8.4–10.5)
Chloride: 104 mEq/L (ref 96–112)
Creatinine, Ser: 0.81 mg/dL (ref 0.40–1.20)
GFR: 73.79 mL/min (ref >60.00)
Glucose, Bld: 118 mg/dL — ABNORMAL HIGH (ref 70–99)
Potassium: 3.8 mEq/L (ref 3.5–5.1)
Sodium: 139 mEq/L (ref 135–145)
Total Bilirubin: 0.6 mg/dL (ref 0.2–1.2)
Total Protein: 6.4 g/dL (ref 6.0–8.3)

## 2019-08-03 LAB — LIPID PANEL
Cholesterol: 154 mg/dL (ref 0–200)
HDL: 44.3 mg/dL (ref >39.00)
LDL Cholesterol: 85 mg/dL (ref 0–99)
NonHDL: 109.24
Total CHOL/HDL Ratio: 3
Triglycerides: 120 mg/dL (ref 0.0–149.0)
VLDL: 24 mg/dL (ref 0.0–40.0)

## 2019-08-03 LAB — VITAMIN D 25 HYDROXY (VIT D DEFICIENCY, FRACTURES): VITD: 71.93 ng/mL (ref 30.00–100.00)

## 2019-08-04 ENCOUNTER — Other Ambulatory Visit: Payer: BC Managed Care – PPO

## 2019-08-29 ENCOUNTER — Other Ambulatory Visit: Payer: Self-pay | Admitting: Internal Medicine

## 2019-09-21 IMAGING — MG DIGITAL SCREENING BILATERAL MAMMOGRAM WITH TOMO AND CAD
8 series · 9 of 24 positions shown · non-contrast
Comparison: Previous exam(s).

CLINICAL DATA: Screening.

EXAM:
DIGITAL SCREENING BILATERAL MAMMOGRAM WITH TOMO AND CAD

[L MLO synth-2D]
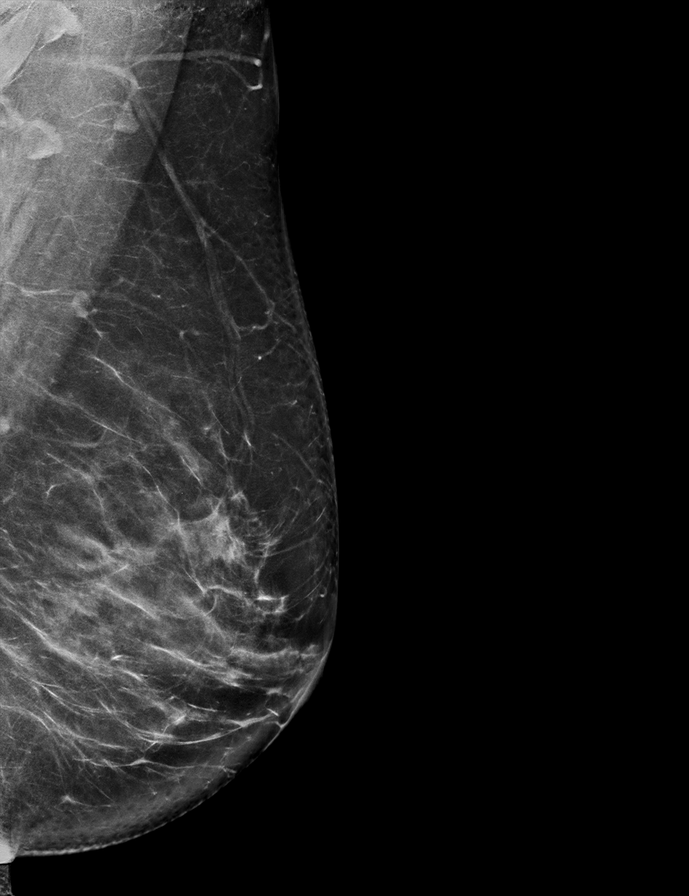

[R MLO synth-2D]
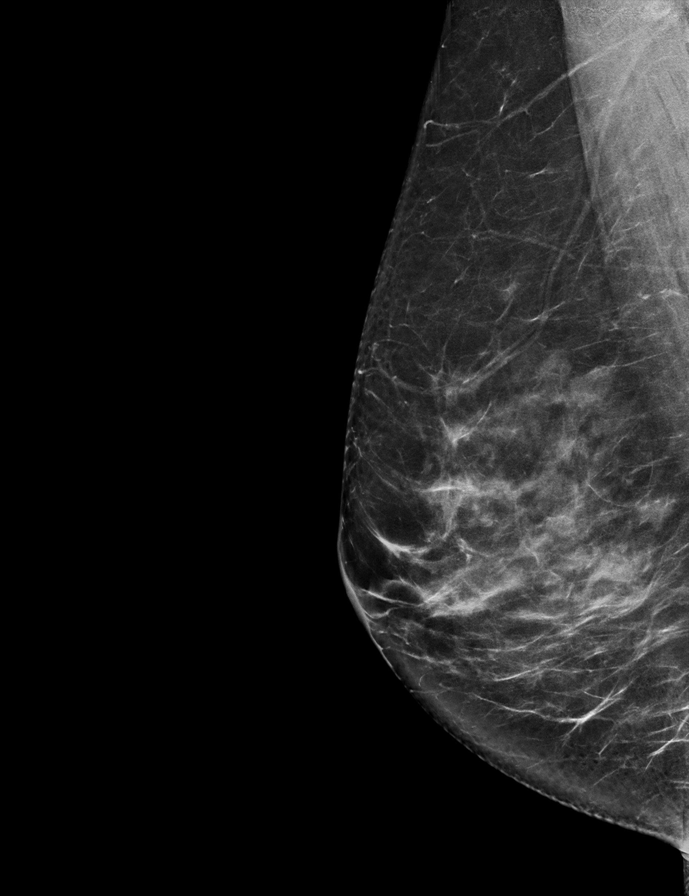

[R CC synth-2D]
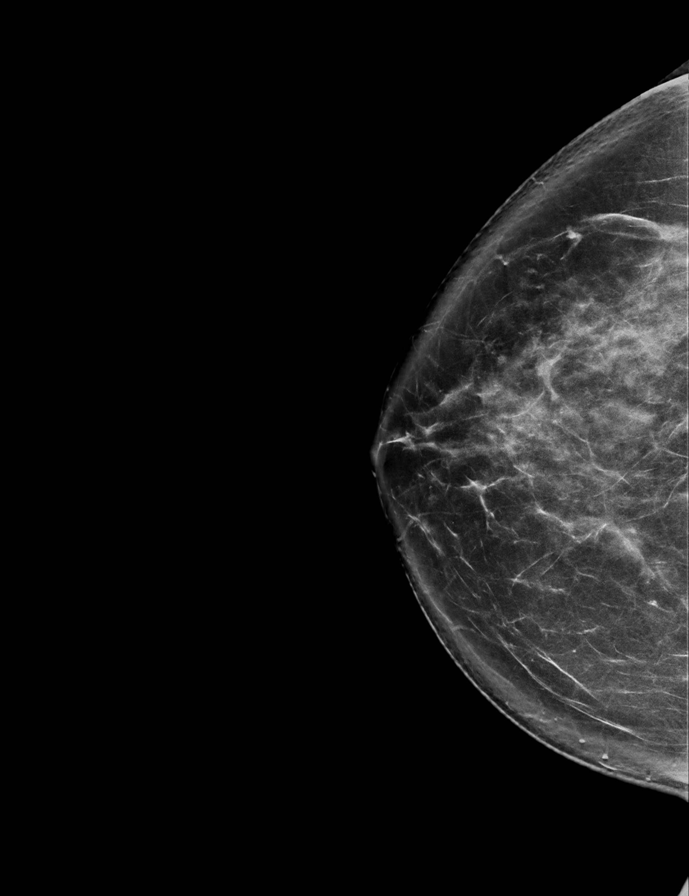

[L CC synth-2D]
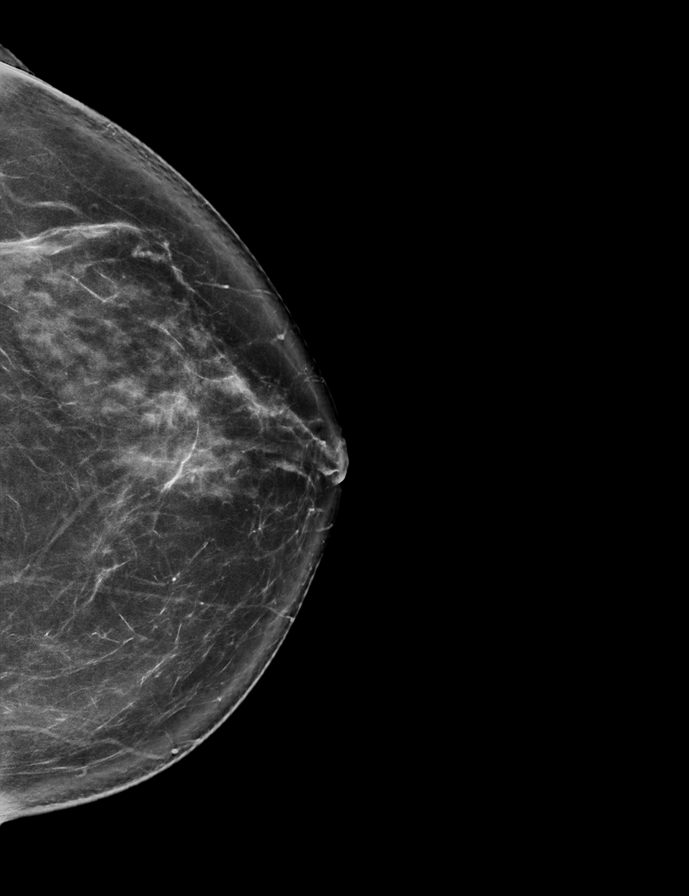

[R MLO tomo · 2 of 76 frames shown]
[frame 25/76]
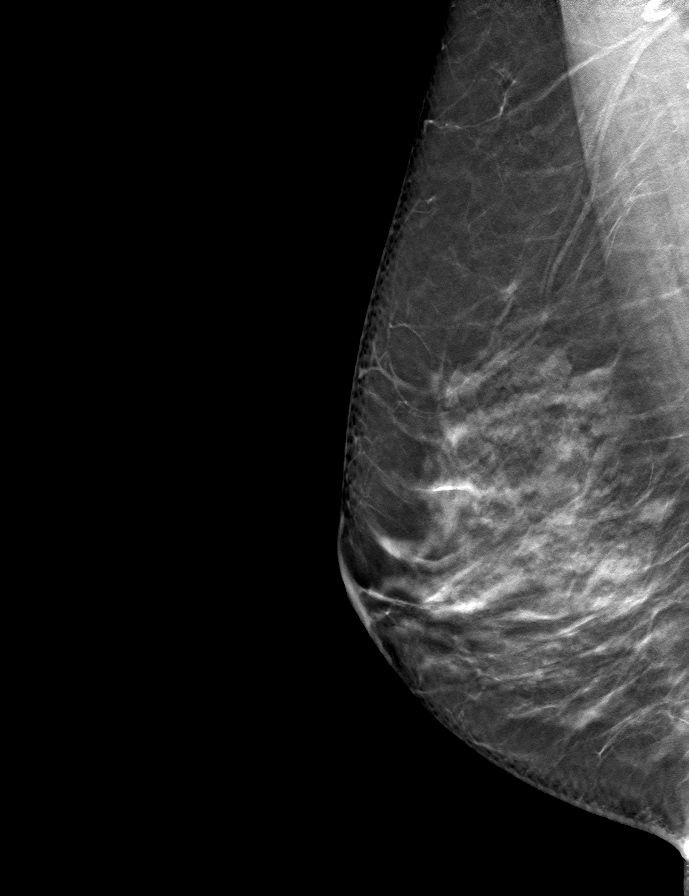
[frame 39/76]
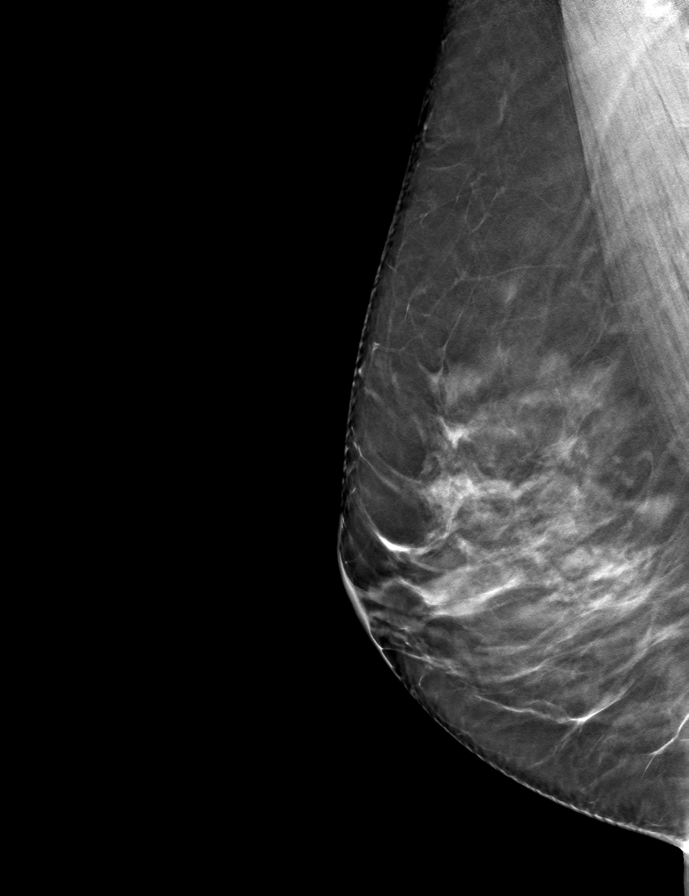

[R CC tomo · tomo slice 41/82.0]
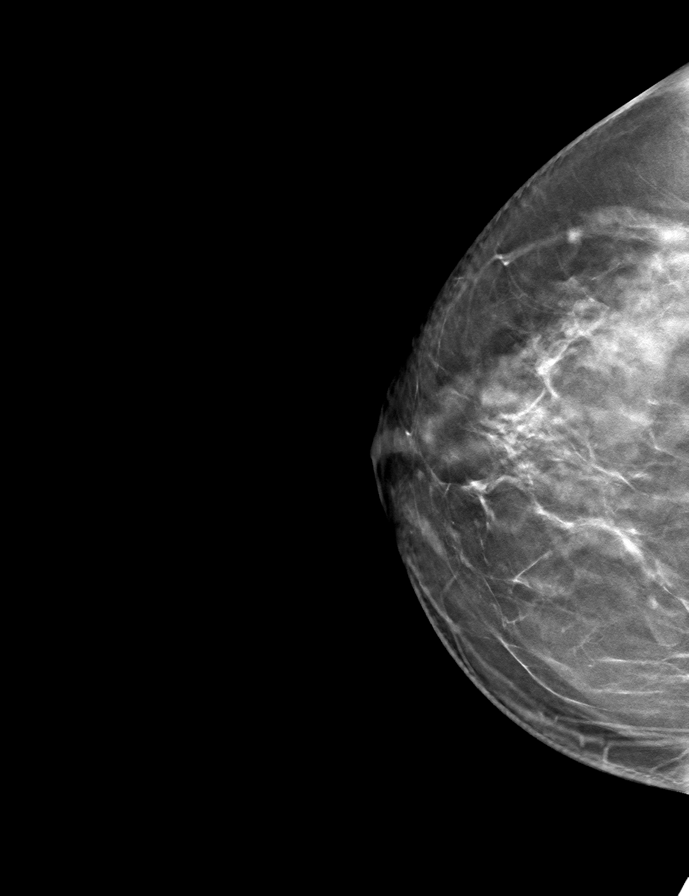

[L CC tomo · tomo slice 41/80.0]
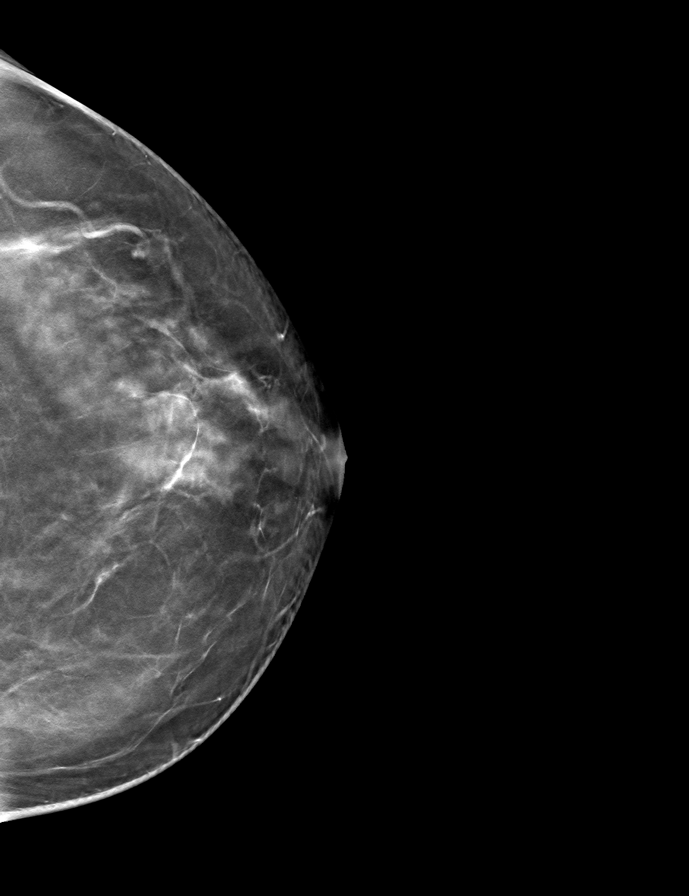

[L MLO tomo · tomo slice 41/81.0]
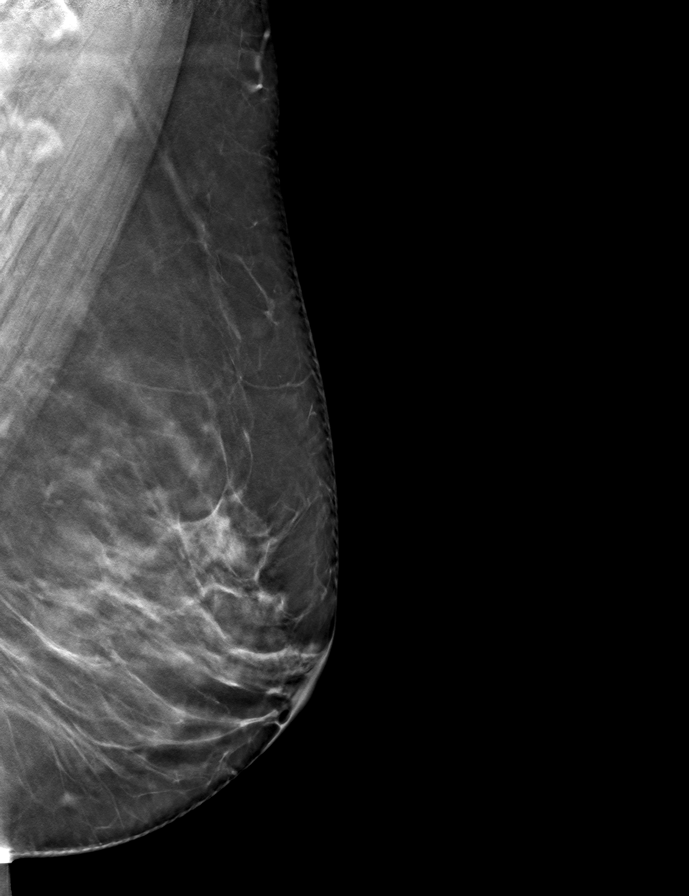

[9 of 24 positions shown; findings below may reference images not displayed]

ACR Breast Density Category c: The breast tissue is heterogeneously
dense, which may obscure small masses.
FINDINGS: There are no findings suspicious for malignancy. Images were
processed with CAD.
IMPRESSION: No mammographic evidence of malignancy. A result letter of this
screening mammogram will be mailed directly to the patient.

RECOMMENDATION:
Screening mammogram in one year. (Code:[id number]-U-LHB)

BI-RADS CATEGORY  1: Negative.

## 2019-11-26 ENCOUNTER — Other Ambulatory Visit: Payer: Self-pay | Admitting: Internal Medicine

## 2020-03-03 ENCOUNTER — Other Ambulatory Visit: Payer: Self-pay | Admitting: Internal Medicine

## 2020-05-22 ENCOUNTER — Encounter: Payer: Self-pay | Admitting: Internal Medicine

## 2020-05-24 ENCOUNTER — Encounter: Payer: Self-pay | Admitting: Internal Medicine

## 2020-05-24 ENCOUNTER — Telehealth (INDEPENDENT_AMBULATORY_CARE_PROVIDER_SITE_OTHER): Payer: BC Managed Care – PPO | Admitting: Internal Medicine

## 2020-05-24 ENCOUNTER — Other Ambulatory Visit: Payer: Self-pay

## 2020-05-24 VITALS — Temp 97.5°F | Wt 133.5 lb

## 2020-05-24 DIAGNOSIS — R509 Fever, unspecified: Secondary | ICD-10-CM | POA: Diagnosis not present

## 2020-05-24 DIAGNOSIS — R5383 Other fatigue: Secondary | ICD-10-CM | POA: Diagnosis not present

## 2020-05-24 DIAGNOSIS — J029 Acute pharyngitis, unspecified: Secondary | ICD-10-CM

## 2020-05-24 MED ORDER — PREDNISONE 10 MG PO TABS: ORAL_TABLET | ORAL | 0 refills | Status: DC

## 2020-05-24 NOTE — Progress Notes (Signed)
Virtual Visit via Video Note  I connected with Orland Dec on 05/24/20 at  2:45 PM EDT by a video enabled telemedicine application and verified that I am speaking with the correct person using two identifiers.  Location: Patient: Home Provider: Office  Persons participating in this video call: Webb Silversmith, NP and Wandra Feinstein  I discussed the limitations of evaluation and management by telemedicine and the availability of in person appointments. The patient expressed understanding and agreed to proceed.  History of Present Illness:  Pt reports fatigue, fever, headache and sore throat. This started 4 days ago. She reports she is so fatigued, she can barely stay awake. She reports her headache is located in her temples. She describes the pain as sharp. She denies dizziness or visual changes. She denies difficulty swallowing. She denies runny nose, nasal congestion, ear pain, loss of taste/smell, cough or SOB. She denies fever, chills or body aches. She has been taking Ibuprofen and Dayquil with minimal relief.   Past Medical History:  Diagnosis Date  . Chicken pox     Current Outpatient Medications  Medication Sig Dispense Refill  . conjugated estrogens (PREMARIN) vaginal cream Place 1 Applicatorful vaginally 3 (three) times a week. 42.5 g 12  . rosuvastatin (CRESTOR) 10 MG tablet Take 1 tablet (10 mg total) by mouth daily. MUST SCHEDULE PHYSICAL 90 tablet 0   No current facility-administered medications for this visit.    Allergies  Allergen Reactions  . Codeine Nausea And Vomiting    Family History  Problem Relation Age of Onset  . Alcohol abuse Father   . Stroke Father   . Heart disease Father   . Cancer Mother        bladder  . Heart disease Maternal Uncle   . Heart disease Maternal Grandfather   . Diabetes Neg Hx     Social History   Socioeconomic History  . Marital status: Single    Spouse name: Not on file  . Number of children: Not on file  . Years of  education: Not on file  . Highest education level: Not on file  Occupational History  . Not on file  Tobacco Use  . Smoking status: Current Some Day Smoker    Types: E-cigarettes  . Smokeless tobacco: Never Used  . Tobacco comment: e-cig  Substance and Sexual Activity  . Alcohol use: Yes    Alcohol/week: 1.0 standard drink    Types: 1 Glasses of wine per week    Comment: rare--wine  . Drug use: No  . Sexual activity: Not Currently  Other Topics Concern  . Not on file  Social History Narrative  . Not on file   Social Determinants of Health   Financial Resource Strain:   . Difficulty of Paying Living Expenses: Not on file  Food Insecurity:   . Worried About Charity fundraiser in the Last Year: Not on file  . Ran Out of Food in the Last Year: Not on file  Transportation Needs:   . Lack of Transportation (Medical): Not on file  . Lack of Transportation (Non-Medical): Not on file  Physical Activity:   . Days of Exercise per Week: Not on file  . Minutes of Exercise per Session: Not on file  Stress:   . Feeling of Stress : Not on file  Social Connections:   . Frequency of Communication with Friends and Family: Not on file  . Frequency of Social Gatherings with Friends and Family: Not on file  .  Attends Religious Services: Not on file  . Active Member of Clubs or Organizations: Not on file  . Attends Archivist Meetings: Not on file  . Marital Status: Not on file  Intimate Partner Violence:   . Fear of Current or Ex-Partner: Not on file  . Emotionally Abused: Not on file  . Physically Abused: Not on file  . Sexually Abused: Not on file     Constitutional: Pt reports fatigue, fever. Denies malaise, headache or abrupt weight changes.  HEENT: Pt reports sore throat. Denies eye pain, eye redness, ear pain, ringing in the ears, wax buildup, runny nose, nasal congestion, bloody nose. Respiratory: Denies difficulty breathing, shortness of breath, cough or sputum  production.   Cardiovascular: Denies chest pain, chest tightness, palpitations or swelling in the hands or feet.  Gastrointestinal: Denies abdominal pain, bloating, constipation, diarrhea or blood in the stool.  GU: Denies urgency, frequency, pain with urination, burning sensation, blood in urine, odor or discharge.  No other specific complaints in a complete review of systems (except as listed in HPI above).    Observations/Objective: Temp (!) 97.5 F (36.4 C) (Temporal)   Wt 133 lb 8 oz (60.6 kg)   BMI 23.65 kg/m   Wt Readings from Last 3 Encounters:  05/03/19 136 lb 6.4 oz (61.9 kg)  12/01/18 132 lb (59.9 kg)  11/01/16 139 lb 1.3 oz (63.1 kg)    General: Appears her stated age, in NAD. HEENT: Head: normal shape and size; Nose: does not sound congested ; Throat/Mouth: does not sound hoarse Cardiovascular: Normal rate and rhythm. S1,S2 noted.  No murmur, rubs or gallops noted. No JVD or BLE edema. No carotid bruits noted. Pulmonary/Chest: Normal effort. No respiratory distress.  Neurological: Alert and oriented.   BMET    Component Value Date/Time   NA 139 08/03/2019 1314   K 3.8 08/03/2019 1314   CL 104 08/03/2019 1314   CO2 31 08/03/2019 1314   GLUCOSE 118 (H) 08/03/2019 1314   BUN 12 08/03/2019 1314   CREATININE 0.81 08/03/2019 1314   CALCIUM 9.0 08/03/2019 1314   GFRNONAA >60 07/11/2009 1045   GFRAA  07/11/2009 1045    >60        The eGFR has been calculated using the MDRD equation. This calculation has not been validated in all clinical situations. eGFR's persistently <60 mL/min signify possible Chronic Kidney Disease.    Lipid Panel     Component Value Date/Time   CHOL 154 08/03/2019 1314   TRIG 120.0 08/03/2019 1314   HDL 44.30 08/03/2019 1314   CHOLHDL 3 08/03/2019 1314   VLDL 24.0 08/03/2019 1314   LDLCALC 85 08/03/2019 1314    CBC    Component Value Date/Time   WBC 8.7 05/03/2019 1125   RBC 4.71 05/03/2019 1125   HGB 14.4 05/03/2019 1125    HCT 42.2 05/03/2019 1125   PLT 269.0 05/03/2019 1125   MCV 89.4 05/03/2019 1125   MCHC 34.1 05/03/2019 1125   RDW 13.3 05/03/2019 1125   LYMPHSABS 2.5 07/11/2009 1045   MONOABS 0.6 07/11/2009 1045   EOSABS 0.1 07/11/2009 1045   BASOSABS 0.1 07/11/2009 1045    Hgb A1C No results found for: HGBA1C     Assessment and Plan:  Fatigue, Fever, Sore Throat:  Encouraged her to go get a Covid test Recommend rest, fluids, self quarantine, social distancing, masking and frequent handwashing RX for Pred Taper for inflammation No indication for abx at this time Work note provided  Return precautions discussed Follow Up Instructions:    I discussed the assessment and treatment plan with the patient. The patient was provided an opportunity to ask questions and all were answered. The patient agreed with the plan and demonstrated an understanding of the instructions.   The patient was advised to call back or seek an in-person evaluation if the symptoms worsen or if the condition fails to improve as anticipated.    Webb Silversmith, NP

## 2020-05-24 NOTE — Patient Instructions (Signed)

## 2020-05-31 ENCOUNTER — Encounter: Payer: Self-pay | Admitting: Internal Medicine

## 2020-05-31 ENCOUNTER — Other Ambulatory Visit: Payer: Self-pay | Admitting: Internal Medicine

## 2020-06-03 NOTE — Telephone Encounter (Signed)
Ok for work note? 

## 2020-09-01 ENCOUNTER — Other Ambulatory Visit: Payer: Self-pay | Admitting: Internal Medicine

## 2020-10-02 ENCOUNTER — Other Ambulatory Visit: Payer: Self-pay | Admitting: Internal Medicine

## 2020-11-03 ENCOUNTER — Other Ambulatory Visit: Payer: Self-pay | Admitting: Internal Medicine

## 2020-12-04 ENCOUNTER — Other Ambulatory Visit: Payer: Self-pay | Admitting: Internal Medicine

## 2020-12-14 ENCOUNTER — Telehealth: Payer: Self-pay | Admitting: Internal Medicine

## 2020-12-14 NOTE — Telephone Encounter (Signed)
Pt called in wanted to know if its possible to do a cpe  LAST APPOINTMENT DATE: 12/04/2020   NEXT APPOINTMENT DATE:@Visit  date not found  MEDICATION:rosuvastatin 10g  PHARMACY: [facility]   Let patient know to contact pharmacy at the end of the day to make sure medication is ready.  Please notify patient to allow 48-72 hours to process  Encourage patient to contact the pharmacy for refills or they can request refills through [place]:   LAST REFILL:  QTY:  REFILL DATE:    OTHER COMMENTS:    Okay for refill?  Please advise

## 2020-12-19 MED ORDER — ROSUVASTATIN CALCIUM 10 MG PO TABS: 10.0000 mg | ORAL_TABLET | Freq: Every day | ORAL | 0 refills | Status: DC

## 2021-04-06 ENCOUNTER — Other Ambulatory Visit: Payer: Self-pay | Admitting: Internal Medicine

## 2021-04-08 NOTE — Telephone Encounter (Signed)
Last office visit 05/24/2020 (video) for ST/Fever/Fatigue with R. Baity.  Last refilled 12/19/2020 for #90 with no refills.  Last Lipid: 08/03/2019.  No TOC appointments.

## 2021-05-14 ENCOUNTER — Ambulatory Visit (INDEPENDENT_AMBULATORY_CARE_PROVIDER_SITE_OTHER): Payer: BC Managed Care – PPO | Admitting: Internal Medicine

## 2021-05-14 ENCOUNTER — Encounter: Payer: Self-pay | Admitting: Internal Medicine

## 2021-05-14 ENCOUNTER — Other Ambulatory Visit: Payer: Self-pay

## 2021-05-14 VITALS — BP 131/63 | HR 83 | Temp 97.0°F | Resp 18 | Ht 63.0 in | Wt 135.8 lb

## 2021-05-14 DIAGNOSIS — M1990 Unspecified osteoarthritis, unspecified site: Secondary | ICD-10-CM | POA: Insufficient documentation

## 2021-05-14 DIAGNOSIS — Z1211 Encounter for screening for malignant neoplasm of colon: Secondary | ICD-10-CM | POA: Diagnosis not present

## 2021-05-14 DIAGNOSIS — Z1159 Encounter for screening for other viral diseases: Secondary | ICD-10-CM

## 2021-05-14 DIAGNOSIS — M8949 Other hypertrophic osteoarthropathy, multiple sites: Secondary | ICD-10-CM | POA: Diagnosis not present

## 2021-05-14 DIAGNOSIS — Z114 Encounter for screening for human immunodeficiency virus [HIV]: Secondary | ICD-10-CM | POA: Diagnosis not present

## 2021-05-14 DIAGNOSIS — Z0001 Encounter for general adult medical examination with abnormal findings: Secondary | ICD-10-CM | POA: Diagnosis not present

## 2021-05-14 DIAGNOSIS — E7800 Pure hypercholesterolemia, unspecified: Secondary | ICD-10-CM | POA: Diagnosis not present

## 2021-05-14 DIAGNOSIS — M159 Polyosteoarthritis, unspecified: Secondary | ICD-10-CM

## 2021-05-14 DIAGNOSIS — E785 Hyperlipidemia, unspecified: Secondary | ICD-10-CM | POA: Insufficient documentation

## 2021-05-14 DIAGNOSIS — Z1231 Encounter for screening mammogram for malignant neoplasm of breast: Secondary | ICD-10-CM | POA: Diagnosis not present

## 2021-05-14 NOTE — Patient Instructions (Signed)
Health Maintenance, Female Adopting a healthy lifestyle and getting preventive care are important in promoting health and wellness. Ask your health care provider about: The right schedule for you to have regular tests and exams. Things you can do on your own to prevent diseases and keep yourself healthy. What should I know about diet, weight, and exercise? Eat a healthy diet  Eat a diet that includes plenty of vegetables, fruits, low-fat dairy products, and lean protein. Do not eat a lot of foods that are high in solid fats, added sugars, or sodium.  Maintain a healthy weight Body mass index (BMI) is used to identify weight problems. It estimates body fat based on height and weight. Your health care provider can help determineyour BMI and help you achieve or maintain a healthy weight. Get regular exercise Get regular exercise. This is one of the most important things you can do for your health. Most adults should: Exercise for at least 150 minutes each week. The exercise should increase your heart rate and make you sweat (moderate-intensity exercise). Do strengthening exercises at least twice a week. This is in addition to the moderate-intensity exercise. Spend less time sitting. Even light physical activity can be beneficial. Watch cholesterol and blood lipids Have your blood tested for lipids and cholesterol at 55 years of age, then havethis test every 5 years. Have your cholesterol levels checked more often if: Your lipid or cholesterol levels are high. You are older than 55 years of age. You are at high risk for heart disease. What should I know about cancer screening? Depending on your health history and family history, you may need to have cancer screening at various ages. This may include screening for: Breast cancer. Cervical cancer. Colorectal cancer. Skin cancer. Lung cancer. What should I know about heart disease, diabetes, and high blood pressure? Blood pressure and heart  disease High blood pressure causes heart disease and increases the risk of stroke. This is more likely to develop in people who have high blood pressure readings, are of African descent, or are overweight. Have your blood pressure checked: Every 3-5 years if you are 18-39 years of age. Every year if you are 40 years old or older. Diabetes Have regular diabetes screenings. This checks your fasting blood sugar level. Have the screening done: Once every three years after age 40 if you are at a normal weight and have a low risk for diabetes. More often and at a younger age if you are overweight or have a high risk for diabetes. What should I know about preventing infection? Hepatitis B If you have a higher risk for hepatitis B, you should be screened for this virus. Talk with your health care provider to find out if you are at risk forhepatitis B infection. Hepatitis C Testing is recommended for: Everyone born from 1945 through 1965. Anyone with known risk factors for hepatitis C. Sexually transmitted infections (STIs) Get screened for STIs, including gonorrhea and chlamydia, if: You are sexually active and are younger than 55 years of age. You are older than 55 years of age and your health care provider tells you that you are at risk for this type of infection. Your sexual activity has changed since you were last screened, and you are at increased risk for chlamydia or gonorrhea. Ask your health care provider if you are at risk. Ask your health care provider about whether you are at high risk for HIV. Your health care provider may recommend a prescription medicine to help   prevent HIV infection. If you choose to take medicine to prevent HIV, you should first get tested for HIV. You should then be tested every 3 months for as long as you are taking the medicine. Pregnancy If you are about to stop having your period (premenopausal) and you may become pregnant, seek counseling before you get  pregnant. Take 400 to 800 micrograms (mcg) of folic acid every day if you become pregnant. Ask for birth control (contraception) if you want to prevent pregnancy. Osteoporosis and menopause Osteoporosis is a disease in which the bones lose minerals and strength with aging. This can result in bone fractures. If you are 65 years old or older, or if you are at risk for osteoporosis and fractures, ask your health care provider if you should: Be screened for bone loss. Take a calcium or vitamin D supplement to lower your risk of fractures. Be given hormone replacement therapy (HRT) to treat symptoms of menopause. Follow these instructions at home: Lifestyle Do not use any products that contain nicotine or tobacco, such as cigarettes, e-cigarettes, and chewing tobacco. If you need help quitting, ask your health care provider. Do not use street drugs. Do not share needles. Ask your health care provider for help if you need support or information about quitting drugs. Alcohol use Do not drink alcohol if: Your health care provider tells you not to drink. You are pregnant, may be pregnant, or are planning to become pregnant. If you drink alcohol: Limit how much you use to 0-1 drink a day. Limit intake if you are breastfeeding. Be aware of how much alcohol is in your drink. In the [place] one drink equals one 12 oz bottle of beer (355 mL), one 5 oz glass of wine (148 mL), or one 1 oz glass of hard liquor (44 mL). General instructions Schedule regular health, dental, and eye exams. Stay current with your vaccines. Tell your health care provider if: You often feel depressed. You have ever been abused or do not feel safe at home. Summary Adopting a healthy lifestyle and getting preventive care are important in promoting health and wellness. Follow your health care provider's instructions about healthy diet, exercising, and getting tested or screened for diseases. Follow your health care provider's  instructions on monitoring your cholesterol and blood pressure. This information is not intended to replace advice given to you by your health care provider. Make sure you discuss any questions you have with your healthcare provider. Document Revised: 09/01/2018 Document Reviewed: 09/01/2018 Elsevier Patient Education  2022 Elsevier Inc.  

## 2021-05-14 NOTE — Progress Notes (Signed)
Subjective:    Patient ID: Diana Bruce, female    DOB: 01/11/1966, 55 y.o.   MRN: [id number]  HPI  Patient presents the clinic today for her annual exam.  HLD: Her last LDL was 85, triglycerides 120, 07/2019.  She denies myalgias on Rosuvastatin.  She tries to consume a low-fat diet.  OA: Mainly in her hands and knees. She takes Naproxen daily with good relief of symptoms.   Flu: 07/2014 Tetanus: 04/2019 COVID: never Shingrix: 11/2019 Pap smear: 04/2019 Mammogram: 04/2019 Colon screening: Never Vision screening: annually Dentist: as needed  Diet: She does eat meat. She consumes fruits and veggies. She does eat some fried foods. She drinks mostly coffee and sweet tea. Exercise: Walking  Review of Systems  Past Medical History:  Diagnosis Date   Chicken pox     Current Outpatient Medications  Medication Sig Dispense Refill   predniSONE (DELTASONE) 10 MG tablet Take 6 tabs day 1, 5 tabs day 2, 4 tabs day 3, 3 tabs day 4, 2 tabs day 5, 1 tab day 6 21 tablet 0   rosuvastatin (CRESTOR) 10 MG tablet TAKE 1 TABLET BY MOUTH EVERY DAY 90 tablet 0   No current facility-administered medications for this visit.    Allergies  Allergen Reactions   Codeine Nausea And Vomiting    Family History  Problem Relation Age of Onset   Alcohol abuse Father    Stroke Father    Heart disease Father    Cancer Mother        bladder   Heart disease Maternal Uncle    Heart disease Maternal Grandfather    Diabetes Neg Hx     Social History   Socioeconomic History   Marital status: Single    Spouse name: Not on file   Number of children: Not on file   Years of education: Not on file   Highest education level: Not on file  Occupational History   Not on file  Tobacco Use   Smoking status: Some Days    Types: E-cigarettes   Smokeless tobacco: Never   Tobacco comments:    e-cig  Substance and Sexual Activity   Alcohol use: Yes    Alcohol/week: 1.0 standard drink    Types: 1  Glasses of wine per week    Comment: rare--wine   Drug use: No   Sexual activity: Not Currently  Other Topics Concern   Not on file  Social History Narrative   Not on file   Social Determinants of Health   Financial Resource Strain: Not on file  Food Insecurity: Not on file  Transportation Needs: Not on file  Physical Activity: Not on file  Stress: Not on file  Social Connections: Not on file  Intimate Partner Violence: Not on file     Constitutional: Denies fever, malaise, fatigue, headache or abrupt weight changes.  HEENT: Patient reports decreased hearing.  Denies eye pain, eye redness, ear pain, ringing in the ears, wax buildup, runny nose, nasal congestion, bloody nose, or sore throat. Respiratory: Denies difficulty breathing, shortness of breath, cough or sputum production.   Cardiovascular: Denies chest pain, chest tightness, palpitations or swelling in the hands or feet.  Gastrointestinal: Denies abdominal pain, bloating, constipation, diarrhea or blood in the stool.  GU: Denies urgency, frequency, pain with urination, burning sensation, blood in urine, odor or discharge. Musculoskeletal: Patient reports joint pain in hands and knees.  Denies decrease in range of motion, difficulty with gait, muscle pain or  joint swelling.  Skin: Denies redness, rashes, lesions or ulcercations.  Neurological: Denies dizziness, difficulty with memory, difficulty with speech or problems with balance and coordination.  Psych: Denies anxiety, depression, SI/HI.  No other specific complaints in a complete review of systems (except as listed in HPI above).     Objective:   Physical Exam  BP 131/63 (BP Location: Left Arm, Patient Position: Sitting, Cuff Size: Normal)   Pulse 83   Temp (!) 97 F (36.1 C) (Temporal)   Resp 18   Ht '5\' 3"'  (1.6 m)   Wt 135 lb 12.8 oz (61.6 kg)   SpO2 100%   BMI 24.06 kg/m   Wt Readings from Last 3 Encounters:  05/24/20 133 lb 8 oz (60.6 kg)  05/03/19  136 lb 6.4 oz (61.9 kg)  12/01/18 132 lb (59.9 kg)    General: Appears her stated age, well developed, well nourished in NAD. Skin: Warm, dry and intact. No rashes noted. HEENT: Head: normal shape and size; Eyes: sclera white and EOMs intact; Ears: Tm's gray and intact, normal light reflex;  Neck:  Neck supple, trachea midline. No masses, lumps or thyromegaly present.  Cardiovascular: Normal rate and rhythm. S1,S2 noted.  No murmur, rubs or gallops noted. No JVD or BLE edema. No carotid bruits noted. Pulmonary/Chest: Normal effort and positive vesicular breath sounds. No respiratory distress. No wheezes, rales or ronchi noted.  Abdomen: Soft and nontender. Normal bowel sounds. No distention or masses noted. Liver, spleen and kidneys non palpable. Musculoskeletal: Strength 5/5 BUE/BLE.  No difficulty with gait.  Neurological: Alert and oriented. Cranial nerves II-XII grossly intact. Coordination normal.  Psychiatric: Mood and affect normal. Behavior is normal. Judgment and thought content normal.    BMET    Component Value Date/Time   NA 139 08/03/2019 1314   K 3.8 08/03/2019 1314   CL 104 08/03/2019 1314   CO2 31 08/03/2019 1314   GLUCOSE 118 (H) 08/03/2019 1314   BUN 12 08/03/2019 1314   CREATININE 0.81 08/03/2019 1314   CALCIUM 9.0 08/03/2019 1314   GFRNONAA >60 07/11/2009 1045   GFRAA  07/11/2009 1045    >60        The eGFR has been calculated using the MDRD equation. This calculation has not been validated in all clinical situations. eGFR's persistently <60 mL/min signify possible Chronic Kidney Disease.    Lipid Panel     Component Value Date/Time   CHOL 154 08/03/2019 1314   TRIG 120.0 08/03/2019 1314   HDL 44.30 08/03/2019 1314   CHOLHDL 3 08/03/2019 1314   VLDL 24.0 08/03/2019 1314   LDLCALC 85 08/03/2019 1314    CBC    Component Value Date/Time   WBC 8.7 05/03/2019 1125   RBC 4.71 05/03/2019 1125   HGB 14.4 05/03/2019 1125   HCT 42.2 05/03/2019 1125    PLT 269.0 05/03/2019 1125   MCV 89.4 05/03/2019 1125   MCHC 34.1 05/03/2019 1125   RDW 13.3 05/03/2019 1125   LYMPHSABS 2.5 07/11/2009 1045   MONOABS 0.6 07/11/2009 1045   EOSABS 0.1 07/11/2009 1045   BASOSABS 0.1 07/11/2009 1045          Assessment & Plan:   Preventative Health Maintenance:  Encouraged her to get a flu shot in the fall Tetanus UTD Encouraged her to get a COVID-vaccine Shingrix UTD, will call [facility] to confirm Pap smear UTD Mammogram ordered, she will call to schedule Cologuard ordered Encouraged her to consume a balanced diet exercise regimen Advised  her to see an eye doctor and dentist annually We will check CBC, c-Met A1c, lipid, HIV and hep C today  RTC in 1 year, sooner if needed Webb Silversmith, NP This visit occurred during the SARS-CoV-2 public health emergency.  Safety protocols were in place, including screening questions prior to the visit, additional usage of staff PPE, and extensive cleaning of exam room while observing appropriate contact time as indicated for disinfecting solutions.

## 2021-05-14 NOTE — Assessment & Plan Note (Signed)
We will check ANA, ESR, CRP and RF today Continue Naproxen OTC as needed

## 2021-05-14 NOTE — Assessment & Plan Note (Signed)
Encouraged her to consume a low-fat diet C-Met and lipid profile today Continue Rosuvastatin

## 2021-05-14 NOTE — Addendum Note (Signed)
Addended by: Jearld Fenton on: 05/14/2021 02:05 PM   Modules accepted: Orders

## 2021-05-15 LAB — COMPLETE METABOLIC PANEL WITH GFR
AG Ratio: 1.9 (calc) (ref 1.0–2.5)
ALT: 17 U/L (ref 6–29)
AST: 21 U/L (ref 10–35)
Albumin: 4.1 g/dL (ref 3.6–5.1)
Alkaline phosphatase (APISO): 71 U/L (ref 37–153)
BUN: 13 mg/dL (ref 7–25)
CO2: 24 mmol/L (ref 20–32)
Calcium: 9 mg/dL (ref 8.6–10.4)
Chloride: 108 mmol/L (ref 98–110)
Creat: 0.76 mg/dL (ref 0.50–1.03)
Globulin: 2.2 g/dL (calc) (ref 1.9–3.7)
Glucose, Bld: 96 mg/dL (ref 65–139)
Potassium: 3.7 mmol/L (ref 3.5–5.3)
Sodium: 141 mmol/L (ref 135–146)
Total Bilirubin: 0.5 mg/dL (ref 0.2–1.2)
Total Protein: 6.3 g/dL (ref 6.1–8.1)
eGFR: 92 mL/min/{1.73_m2} (ref >=60)

## 2021-05-16 ENCOUNTER — Encounter: Payer: Self-pay | Admitting: Internal Medicine

## 2021-05-16 LAB — ANTI-NUCLEAR AB-TITER (ANA TITER): ANA Titer 1: 1:80 {titer} — ABNORMAL HIGH

## 2021-05-16 LAB — ANA: Anti Nuclear Antibody (ANA): POSITIVE — AB

## 2021-05-16 LAB — RHEUMATOID FACTOR: Rheumatoid fact SerPl-aCnc: 19 IU/mL — ABNORMAL HIGH (ref <14)

## 2021-05-16 LAB — C-REACTIVE PROTEIN: CRP: 4.7 mg/L (ref <8.0)

## 2021-05-16 NOTE — Addendum Note (Signed)
Addended by: Jearld Fenton on: 05/16/2021 12:44 PM   Modules accepted: Orders

## 2021-05-17 ENCOUNTER — Other Ambulatory Visit: Payer: Self-pay

## 2021-05-17 DIAGNOSIS — Z114 Encounter for screening for human immunodeficiency virus [HIV]: Secondary | ICD-10-CM

## 2021-05-17 DIAGNOSIS — Z1159 Encounter for screening for other viral diseases: Secondary | ICD-10-CM

## 2021-05-17 DIAGNOSIS — Z0001 Encounter for general adult medical examination with abnormal findings: Secondary | ICD-10-CM

## 2021-05-17 NOTE — Addendum Note (Signed)
Addended by: Jearld Fenton on: 05/17/2021 11:07 AM   Modules accepted: Orders

## 2021-05-20 LAB — CBC
HCT: 40 % (ref 35.0–45.0)
Hemoglobin: 13.5 g/dL (ref 11.7–15.5)
MCH: 30.4 pg (ref 27.0–33.0)
MCHC: 33.8 g/dL (ref 32.0–36.0)
MCV: 90.1 fL (ref 80.0–100.0)
MPV: 10.7 fL (ref 7.5–12.5)
Platelets: 255 10*3/uL (ref 140–400)
RBC: 4.44 10*6/uL (ref 3.80–5.10)
RDW: 12.3 % (ref 11.0–15.0)
WBC: 9.1 10*3/uL (ref 3.8–10.8)

## 2021-05-20 LAB — LIPID PANEL
Cholesterol: 165 mg/dL (ref <200)
HDL: 53 mg/dL (ref >=50)
LDL Cholesterol (Calc): 93 mg/dL (calc)
Non-HDL Cholesterol (Calc): 112 mg/dL (calc) (ref <130)
Total CHOL/HDL Ratio: 3.1 (calc) (ref <5.0)
Triglycerides: 92 mg/dL (ref <150)

## 2021-05-20 LAB — HEMOGLOBIN A1C
Hgb A1c MFr Bld: 5.2 % of total Hgb (ref <5.7)
Mean Plasma Glucose: 103 mg/dL
eAG (mmol/L): 5.7 mmol/L

## 2021-05-20 LAB — HEPATITIS C ANTIBODY
Hepatitis C Ab: NONREACTIVE
SIGNAL TO CUT-OFF: 0.01 (ref <1.00)

## 2021-05-20 LAB — HIV ANTIBODY (ROUTINE TESTING W REFLEX): HIV 1&2 Ab, 4th Generation: NONREACTIVE

## 2021-05-22 LAB — COLOGUARD

## 2021-05-25 ENCOUNTER — Encounter: Payer: Self-pay | Admitting: Internal Medicine

## 2021-05-28 NOTE — Telephone Encounter (Signed)
Rheumatology referral was placed on 8/26

## 2021-07-18 ENCOUNTER — Other Ambulatory Visit: Payer: Self-pay | Admitting: Family

## 2021-07-19 ENCOUNTER — Other Ambulatory Visit: Payer: Self-pay | Admitting: Internal Medicine

## 2021-07-20 MED ORDER — ROSUVASTATIN CALCIUM 10 MG PO TABS: 10.0000 mg | ORAL_TABLET | Freq: Every day | ORAL | 0 refills | Status: DC

## 2021-10-29 ENCOUNTER — Other Ambulatory Visit: Payer: Self-pay | Admitting: Internal Medicine

## 2021-10-29 NOTE — Telephone Encounter (Signed)
PCP note states for pt. To return in 1 year.  Requested Prescriptions  Pending Prescriptions Disp Refills   rosuvastatin (CRESTOR) 10 MG tablet [Pharmacy Med Name: ROSUVASTATIN CALCIUM 10 MG TAB] 90 tablet 0    Sig: TAKE 1 TABLET BY MOUTH EVERY DAY     Cardiovascular:  Antilipid - Statins 2 Failed - 10/29/2021 12:00 PM      Failed - Lipid Panel in normal range within the last 12 months    Cholesterol  Date Value Ref Range Status  05/17/2021 165 <200 mg/dL Final   LDL Cholesterol (Calc)  Date Value Ref Range Status  05/17/2021 93 mg/dL (calc) Final    Comment:    Reference range: <100 . Desirable range <100 mg/dL for primary prevention;   <70 mg/dL for patients with CHD or diabetic patients  with > or = 2 CHD risk factors. Marland Kitchen LDL-C is now calculated using the Martin-Hopkins  calculation, which is a validated novel method providing  better accuracy than the Friedewald equation in the  estimation of LDL-C.  Cresenciano Genre et al. Annamaria Helling. 7371;062(69): 2061-2068  (http://education.[facility].com/faq/FAQ164)    HDL  Date Value Ref Range Status  05/17/2021 53 > OR = 50 mg/dL Final   Triglycerides  Date Value Ref Range Status  05/17/2021 92 <150 mg/dL Final         Passed - Cr in normal range and within 360 days    Creat  Date Value Ref Range Status  05/14/2021 0.76 0.50 - 1.03 mg/dL Final         Passed - Patient is not pregnant      Passed - Valid encounter within last 12 months    Recent Outpatient Visits          5 months ago Encounter for general adult medical examination with abnormal findings   [facility] [place], Coralie Keens, NP

## 2021-12-31 ENCOUNTER — Encounter: Payer: Self-pay | Admitting: Internal Medicine

## 2021-12-31 ENCOUNTER — Ambulatory Visit (INDEPENDENT_AMBULATORY_CARE_PROVIDER_SITE_OTHER): Payer: BC Managed Care – PPO | Admitting: Internal Medicine

## 2021-12-31 VITALS — BP 136/80 | HR 90 | Temp 97.1°F | Wt 132.0 lb

## 2021-12-31 DIAGNOSIS — Z1211 Encounter for screening for malignant neoplasm of colon: Secondary | ICD-10-CM | POA: Diagnosis not present

## 2021-12-31 DIAGNOSIS — R768 Other specified abnormal immunological findings in serum: Secondary | ICD-10-CM

## 2021-12-31 DIAGNOSIS — M159 Polyosteoarthritis, unspecified: Secondary | ICD-10-CM

## 2021-12-31 DIAGNOSIS — N898 Other specified noninflammatory disorders of vagina: Secondary | ICD-10-CM

## 2021-12-31 DIAGNOSIS — R8290 Unspecified abnormal findings in urine: Secondary | ICD-10-CM

## 2021-12-31 DIAGNOSIS — N368 Other specified disorders of urethra: Secondary | ICD-10-CM

## 2021-12-31 DIAGNOSIS — R700 Elevated erythrocyte sedimentation rate: Secondary | ICD-10-CM

## 2021-12-31 DIAGNOSIS — R300 Dysuria: Secondary | ICD-10-CM | POA: Diagnosis not present

## 2021-12-31 LAB — POCT URINALYSIS DIPSTICK
Bilirubin, UA: NEGATIVE
Glucose, UA: NEGATIVE
Ketones, UA: NEGATIVE
Nitrite, UA: NEGATIVE
Protein, UA: POSITIVE — AB
Spec Grav, UA: 1.015 (ref 1.010–1.025)
Urobilinogen, UA: 0.2 E.U./dL
pH, UA: 5 (ref 5.0–8.0)

## 2021-12-31 MED ORDER — NITROFURANTOIN MONOHYD MACRO 100 MG PO CAPS: 100.0000 mg | ORAL_CAPSULE | Freq: Two times a day (BID) | ORAL | 0 refills | Status: DC

## 2021-12-31 MED ORDER — FLUCONAZOLE 150 MG PO TABS: 150.0000 mg | ORAL_TABLET | Freq: Once | ORAL | 0 refills | Status: AC

## 2021-12-31 NOTE — Patient Instructions (Signed)
Dysuria ?Dysuria is pain or discomfort during urination. The pain or discomfort may be felt in the part of the body that drains urine from the bladder (urethra) or in the surrounding tissue of the genitals. The pain may also be felt in the groin area, lower abdomen, or lower back. ?You may have to urinate frequently or have the sudden feeling that you have to urinate (urgency). Dysuria can affect anyone, but it is more common in females. Dysuria can be caused by many different things, including: ?Urinary tract infection. ?Kidney stones or bladder stones. ?Certain STIs (sexually transmitted infections), such as chlamydia. ?Dehydration. ?Inflammation of the tissues of the vagina. ?Use of certain medicines. ?Use of certain soaps or scented products that cause irritation. ?Follow these instructions at home: ?Medicines ?Take over-the-counter and prescription medicines only as told by your health care provider. ?If you were prescribed an antibiotic medicine, take it as told by your health care provider. Do not stop taking the antibiotic even if you start to feel better. ?Eating and drinking ? ?Drink enough fluid to keep your urine pale yellow. ?Avoid caffeinated beverages, tea, and alcohol. These beverages can irritate the bladder and make dysuria worse. In males, alcohol may irritate the prostate. ?General instructions ?Watch your condition for any changes. ?Urinate often. Avoid holding urine for long periods of time. ?If you are female, you should wipe from front to back after urinating or having a bowel movement. Use each piece of toilet paper only once. ?Empty your bladder after sex. ?Keep all follow-up visits. This is important. ?If you had any tests done to find the cause of dysuria, it is up to you to get your test results. Ask your health care provider, or the department that is doing the test, when your results will be ready. ?Contact a health care provider if: ?You have a fever. ?You develop pain in your back or  sides. ?You have nausea or vomiting. ?You have blood in your urine. ?You are not urinating as often as you usually do. ?Get help right away if: ?Your pain is severe and not relieved with medicines. ?You cannot eat or drink without vomiting. ?You are confused. ?You have a rapid heartbeat while resting. ?You have shaking or chills. ?You feel extremely weak. ?Summary ?Dysuria is pain or discomfort while urinating. Many different conditions can lead to dysuria. ?If you have dysuria, you may have to urinate frequently or have the sudden feeling that you have to urinate (urgency). ?Watch your condition for any changes. Keep all follow-up visits. ?Make sure that you urinate often and drink enough fluid to keep your urine pale yellow. ?This information is not intended to replace advice given to you by your health care provider. Make sure you discuss any questions you have with your health care provider. ?Document Revised: 04/20/2020 Document Reviewed: 04/20/2020 ?Elsevier Patient Education ? 2022 Elsevier Inc. ? ?

## 2021-12-31 NOTE — Progress Notes (Signed)
HPI ? ?Pt presents to the clinic today with c/o dysuria, cloudy urine and urethral swelling. She reports this started 1 month ago.  She reports the pain is typically at the end of her stream.  She reports some associated vaginal itching but denies vaginal discharge, odor or abnormal vaginal bleeding.  She denies urinary urgency, frequency or blood in her urine.  She denies pelvic pain, fever, chills, nausea, vomiting or low back pain.  She has tried AZO OTC with minimal relief of symptoms. ? ?She would also like to follow-up about her referral to rheumatology for joint pain in her hands.  She was seen 04/2021 for the same.  ESR and ANA were elevated at that time.  She reports she never heard from rheumatology about an appointment. ? ? ?Review of Systems ? ?Past Medical History:  ?Diagnosis Date  ? Chicken pox   ? ? ?Family History  ?Problem Relation Age of Onset  ? Alcohol abuse Father   ? Stroke Father   ? Heart disease Father   ? Cancer Mother   ?     bladder  ? Heart disease Maternal Uncle   ? Heart disease Maternal Grandfather   ? Diabetes Neg Hx   ? ? ?Social History  ? ?Socioeconomic History  ? Marital status: Married  ?  Spouse name: Not on file  ? Number of children: Not on file  ? Years of education: Not on file  ? Highest education level: Not on file  ?Occupational History  ? Not on file  ?Tobacco Use  ? Smoking status: Every Day  ?  Packs/day: 1.00  ?  Types: Cigarettes  ? Smokeless tobacco: Never  ?Vaping Use  ? Vaping Use: Former  ?Substance and Sexual Activity  ? Alcohol use: Yes  ?  Alcohol/week: 1.0 standard drink  ?  Types: 1 Glasses of wine per week  ?  Comment: rare--wine  ? Drug use: No  ? Sexual activity: Not Currently  ?Other Topics Concern  ? Not on file  ?Social History Narrative  ? Not on file  ? ?Social Determinants of Health  ? ?Financial Resource Strain: Not on file  ?Food Insecurity: Not on file  ?Transportation Needs: Not on file  ?Physical Activity: Not on file  ?Stress: Not on file   ?Social Connections: Not on file  ?Intimate Partner Violence: Not on file  ? ? ?Allergies  ?Allergen Reactions  ? Codeine Nausea And Vomiting  ? ?  ?Constitutional: Denies fever, malaise, fatigue, headache or abrupt weight changes.   ?GU: Pt reports dysuria, cloudy urine, urethral swelling and itching. Denies urgency, frequency burning sensation, blood in urine, odor or discharge. ?Skin: Denies redness, rashes, lesions or ulcercations.  ?MSK: Patient reports joint pain and swelling in left hand. Denies muscle pain. ? ?No other specific complaints in a complete review of systems (except as listed in HPI above). ? ?  ?Objective:  ? Physical Exam ? ?BP 136/80 (BP Location: Left Arm, Patient Position: Sitting, Cuff Size: Normal)   Pulse 90   Temp (!) 97.1 ?F (36.2 ?C) (Temporal)   Wt 132 lb (59.9 kg)   SpO2 98%   BMI 23.38 kg/m?  ? ?Wt Readings from Last 3 Encounters:  ?05/14/21 135 lb 12.8 oz (61.6 kg)  ?05/24/20 133 lb 8 oz (60.6 kg)  ?05/03/19 136 lb 6.4 oz (61.9 kg)  ? ? ?General: Appears her stated age, well developed, well nourished in NAD. ?Cardiovascular: Normal rate and rhythm. S1,S2 noted.   ?  Pulmonary/Chest: Normal effort and positive vesicular breath sounds. No respiratory distress. No wheezes, rales or ronchi noted.  ?Abdomen: Soft. Normal bowel sounds and nontender. No distention or masses noted. No CVA tenderness. ?Pelvic: Normal female anatomy. No urethral swelling noted. No vaginal discharge noted. ?MSK: Joint enlargement noted in fingers of left hand. No swelling noted. ? ?     ?Assessment & Plan:  ? ?Dysuria, Cloudy Urine, Urethral Swelling, Itching: ? ?Urinalysis: tracel leuks, trace blood ?Will send urine culture ?Rx sent if for Macrobid 100 mg BID x 5 days ?Rx for Diflucan 150 mg PO x 1 ?Drink plenty of fluids ? ?Joint Pain in Hands, Elevated ESR, Positive ANA: ? ?Referral to rheumatology placed for further evaluation and treatment ? ?RTC as needed or if symptoms persist. ?Webb Silversmith,  NP ? ?

## 2022-01-01 LAB — URINE CULTURE
MICRO NUMBER:: 13248757
Result:: NO GROWTH
SPECIMEN QUALITY:: ADEQUATE

## 2022-01-02 ENCOUNTER — Telehealth: Payer: BC Managed Care – PPO | Admitting: Internal Medicine

## 2022-02-05 ENCOUNTER — Telehealth: Payer: Self-pay

## 2022-02-05 DIAGNOSIS — Z1211 Encounter for screening for malignant neoplasm of colon: Secondary | ICD-10-CM

## 2022-02-05 DIAGNOSIS — R195 Other fecal abnormalities: Secondary | ICD-10-CM

## 2022-02-05 LAB — COLOGUARD: COLOGUARD: POSITIVE — AB

## 2022-02-05 NOTE — Telephone Encounter (Signed)
Pt advised.  She agreed to the referral to GI.  ? ? ? ?Thanks,  ? ?-Mickel Baas  ?

## 2022-02-05 NOTE — Telephone Encounter (Signed)
-----   Message from Jearld Fenton, NP sent at 02/05/2022  9:02 AM EDT ----- ?Her Cologuard is positive.  If she agreeable to referral to GI for colonoscopy? ?

## 2022-02-06 ENCOUNTER — Other Ambulatory Visit: Payer: Self-pay | Admitting: Internal Medicine

## 2022-02-06 ENCOUNTER — Telehealth: Payer: Self-pay

## 2022-02-06 ENCOUNTER — Other Ambulatory Visit: Payer: Self-pay

## 2022-02-06 DIAGNOSIS — Z1211 Encounter for screening for malignant neoplasm of colon: Secondary | ICD-10-CM

## 2022-02-06 DIAGNOSIS — R195 Other fecal abnormalities: Secondary | ICD-10-CM

## 2022-02-06 MED ORDER — NA SULFATE-K SULFATE-MG SULF 17.5-3.13-1.6 GM/177ML PO SOLN: 1.0000 | Freq: Once | ORAL | 0 refills | Status: AC

## 2022-02-06 NOTE — Telephone Encounter (Signed)
Requested Prescriptions  Pending Prescriptions Disp Refills  . rosuvastatin (CRESTOR) 10 MG tablet [Pharmacy Med Name: ROSUVASTATIN CALCIUM 10 MG TAB] 90 tablet 0    Sig: TAKE 1 TABLET BY MOUTH EVERY DAY     Cardiovascular:  Antilipid - Statins 2 Failed - 02/06/2022  2:11 AM      Failed - Lipid Panel in normal range within the last 12 months    Cholesterol  Date Value Ref Range Status  05/17/2021 165 <200 mg/dL Final   LDL Cholesterol (Calc)  Date Value Ref Range Status  05/17/2021 93 mg/dL (calc) Final    Comment:    Reference range: <100 . Desirable range <100 mg/dL for primary prevention;   <70 mg/dL for patients with CHD or diabetic patients  with > or = 2 CHD risk factors. Marland Kitchen LDL-C is now calculated using the Martin-Hopkins  calculation, which is a validated novel method providing  better accuracy than the Friedewald equation in the  estimation of LDL-C.  Cresenciano Genre et al. Annamaria Helling. 1761;607(37): 2061-2068  (http://education.[facility].com/faq/FAQ164)    HDL  Date Value Ref Range Status  05/17/2021 53 > OR = 50 mg/dL Final   Triglycerides  Date Value Ref Range Status  05/17/2021 92 <150 mg/dL Final         Passed - Cr in normal range and within 360 days    Creat  Date Value Ref Range Status  05/14/2021 0.76 0.50 - 1.03 mg/dL Final         Passed - Patient is not pregnant      Passed - Valid encounter within last 12 months    Recent Outpatient Visits          1 month ago [facility] [place], Coralie Keens, NP   8 months ago Encounter for general adult medical examination with abnormal findings   [facility] [place], Coralie Keens, NP

## 2022-02-06 NOTE — Telephone Encounter (Signed)
Cologuard ordered

## 2022-02-06 NOTE — Telephone Encounter (Signed)
Gastroenterology Pre-Procedure Review  Request Date: 02/25/22 Requesting Physician: Dr. Marius Ditch  PATIENT REVIEW QUESTIONS: The patient responded to the following health history questions as indicated:    1. Are you having any GI issues? no GI issues noticed by patient.  Referral from PCP states Positive Cologuard.  This is pts first colonoscopy. 2. Do you have a personal history of Polyps? no 3. Do you have a family history of Colon Cancer or Polyps? no 4. Diabetes Mellitus? no 5. Joint replacements in the past 12 months?no 6. Major health problems in the past 3 months?no 7. Any artificial heart valves, MVP, or defibrillator?no    MEDICATIONS & ALLERGIES:    Patient reports the following regarding taking any anticoagulation/antiplatelet therapy:   Plavix, Coumadin, Eliquis, Xarelto, Lovenox, Pradaxa, Brilinta, or Effient? no Aspirin? no  Patient confirms/reports the following medications:  Current Outpatient Medications  Medication Sig Dispense Refill   Calcium-Magnesium-Vitamin D (CALCIUM 1200+D3 PO) Take by mouth. (Patient not taking: Reported on 12/31/2021)     Magnesium Citrate 200 MG TABS Take by mouth.     Multiple Vitamin (MULTIVITAMIN) capsule Take 1 capsule by mouth daily.     Naproxen Sodium (ALEVE PO) Take 1 tablet by mouth daily.     nitrofurantoin, macrocrystal-monohydrate, (MACROBID) 100 MG capsule Take 1 capsule (100 mg total) by mouth 2 (two) times daily. 10 capsule 0   rosuvastatin (CRESTOR) 10 MG tablet TAKE 1 TABLET BY MOUTH EVERY DAY 90 tablet 0   No current facility-administered medications for this visit.    Patient confirms/reports the following allergies:  Allergies  Allergen Reactions   Codeine Nausea And Vomiting    No orders of the defined types were placed in this encounter.   AUTHORIZATION INFORMATION Primary Insurance: 1D#: Group #:  Secondary Insurance: 1D#: Group #:  SCHEDULE INFORMATION: Date: 02/25/22 Time: Location: [facility]

## 2022-02-25 ENCOUNTER — Ambulatory Visit: Payer: BC Managed Care – PPO | Admitting: Anesthesiology

## 2022-02-25 ENCOUNTER — Encounter
Admission: RE | Disposition: A | Discharge status: Home / Self Care | Payer: Self-pay | Source: Home / Self Care | Attending: Gastroenterology

## 2022-02-25 ENCOUNTER — Ambulatory Visit
Admission: RE | Admit: 2022-02-25 | Discharge: 2022-02-25 | Disposition: A | Discharge status: Home / Self Care | Payer: BC Managed Care – PPO | Attending: Gastroenterology | Admitting: Gastroenterology

## 2022-02-25 ENCOUNTER — Encounter: Payer: Self-pay | Admitting: Gastroenterology

## 2022-02-25 DIAGNOSIS — Z1211 Encounter for screening for malignant neoplasm of colon: Secondary | ICD-10-CM | POA: Diagnosis present

## 2022-02-25 DIAGNOSIS — D127 Benign neoplasm of rectosigmoid junction: Secondary | ICD-10-CM | POA: Insufficient documentation

## 2022-02-25 DIAGNOSIS — D369 Benign neoplasm, unspecified site: Secondary | ICD-10-CM

## 2022-02-25 DIAGNOSIS — K635 Polyp of colon: Secondary | ICD-10-CM

## 2022-02-25 DIAGNOSIS — R195 Other fecal abnormalities: Secondary | ICD-10-CM | POA: Insufficient documentation

## 2022-02-25 DIAGNOSIS — D122 Benign neoplasm of ascending colon: Secondary | ICD-10-CM | POA: Insufficient documentation

## 2022-02-25 DIAGNOSIS — K5730 Diverticulosis of large intestine without perforation or abscess without bleeding: Secondary | ICD-10-CM | POA: Insufficient documentation

## 2022-02-25 HISTORY — PX: COLONOSCOPY WITH PROPOFOL: SHX5780

## 2022-02-25 SURGERY — COLONOSCOPY WITH PROPOFOL
Anesthesia: General

## 2022-02-25 MED ORDER — PHENYLEPHRINE HCL (PRESSORS) 10 MG/ML IV SOLN
INTRAVENOUS | Status: DC | PRN
  Administered 2022-02-25: 80 ug via INTRAVENOUS
  Administered 2022-02-25: 160 ug via INTRAVENOUS
  Administered 2022-02-25: 80 ug via INTRAVENOUS

## 2022-02-25 MED ORDER — PROPOFOL 10 MG/ML IV BOLUS
INTRAVENOUS | Status: DC | PRN
  Administered 2022-02-25: 60 mg via INTRAVENOUS
  Administered 2022-02-25: 20 mg via INTRAVENOUS

## 2022-02-25 MED ORDER — SODIUM CHLORIDE 0.9 % IV SOLN: INTRAVENOUS | Status: DC

## 2022-02-25 MED ORDER — PROPOFOL 500 MG/50ML IV EMUL
INTRAVENOUS | Status: DC | PRN
  Administered 2022-02-25: 200 ug/kg/min via INTRAVENOUS

## 2022-02-25 NOTE — Anesthesia Preprocedure Evaluation (Signed)
Anesthesia Evaluation  Patient identified by MRN, date of birth, ID band Patient awake    Reviewed: Allergy & Precautions, H&P , NPO status , Patient's Chart, lab work & pertinent test results, reviewed documented beta blocker date and time   Airway Mallampati: II   Neck ROM: full    Dental  (+) Poor Dentition   Pulmonary neg pulmonary ROS, Current Smoker,    Pulmonary exam normal        Cardiovascular Exercise Tolerance: Good negative cardio ROS Normal cardiovascular exam Rhythm:regular Rate:Normal     Neuro/Psych negative neurological ROS  negative psych ROS   GI/Hepatic negative GI ROS, Neg liver ROS,   Endo/Other  negative endocrine ROS  Renal/GU negative Renal ROS  negative genitourinary   Musculoskeletal   Abdominal   Peds  Hematology negative hematology ROS (+)   Anesthesia Other Findings Past Medical History: No date: Chicken pox Past Surgical History: No date: CESAREAN SECTION No date: FINGER SURGERY 09/22/1982: KNEE ARTHROSCOPY; Left   Reproductive/Obstetrics negative OB ROS                             Anesthesia Physical Anesthesia Plan  ASA: 1  Anesthesia Plan: General   Post-op Pain Management:    Induction:   PONV Risk Score and Plan:   Airway Management Planned:   Additional Equipment:   Intra-op Plan:   Post-operative Plan:   Informed Consent: I have reviewed the patients History and Physical, chart, labs and discussed the procedure including the risks, benefits and alternatives for the proposed anesthesia with the patient or authorized representative who has indicated his/her understanding and acceptance.     Dental Advisory Given  Plan Discussed with: CRNA  Anesthesia Plan Comments:         Anesthesia Quick Evaluation

## 2022-02-25 NOTE — Transfer of Care (Signed)
Immediate Anesthesia Transfer of Care Note  Patient: Diana Bruce  Procedure(s) Performed: COLONOSCOPY WITH PROPOFOL  Patient Location: PACU  Anesthesia Type:General  Level of Consciousness: sedated  Airway & Oxygen Therapy: Patient Spontanous Breathing and Patient connected to nasal cannula oxygen  Post-op Assessment: Report given to RN and Post -op Vital signs reviewed and stable  Post vital signs: Reviewed and stable  Last Vitals:  Vitals Value Taken Time  BP 100/55 02/25/22 1051  Temp 36.4 C 02/25/22 1050  Pulse 84 02/25/22 1052  Resp 18 02/25/22 1052  SpO2 97 % 02/25/22 1052  Vitals shown include unvalidated device data.  Last Pain:  Vitals:   02/25/22 1050  TempSrc: Temporal  PainSc: Asleep         Complications: No notable events documented.

## 2022-02-25 NOTE — Anesthesia Postprocedure Evaluation (Signed)
Anesthesia Post Note  Patient: Diana Bruce  Procedure(s) Performed: COLONOSCOPY WITH PROPOFOL  Patient location during evaluation: PACU Anesthesia Type: General Level of consciousness: awake and alert Pain management: pain level controlled Vital Signs Assessment: post-procedure vital signs reviewed and stable Respiratory status: spontaneous breathing, nonlabored ventilation, respiratory function stable and patient connected to nasal cannula oxygen Cardiovascular status: blood pressure returned to baseline and stable Postop Assessment: no apparent nausea or vomiting Anesthetic complications: no   No notable events documented.   Last Vitals:  Vitals:   02/25/22 1050 02/25/22 1100  BP: (!) 100/55 98/67  Pulse: 85   Resp: 17   Temp: 36.4 C   SpO2: 97%     Last Pain:  Vitals:   02/25/22 1110  TempSrc:   PainSc: 0-No pain                 Molli Barrows

## 2022-02-25 NOTE — Anesthesia Procedure Notes (Signed)
Date/Time: 02/25/2022 10:22 AM Performed by: Nelda Marseille, CRNA Pre-anesthesia Checklist: Patient identified, Emergency Drugs available, Suction available, Patient being monitored and Timeout performed Oxygen Delivery Method: Nasal cannula

## 2022-02-25 NOTE — H&P (Addendum)
Cephas Darby, MD [address]  [place]  [place], [place] [address]  Main: [phone]  Fax: [phone] Pager: [phone]  Primary Care Physician:  Jearld Fenton, NP Primary Gastroenterologist:  Dr. Cephas Darby  Pre-Procedure History & Physical: HPI:  Diana Bruce is a 56 y.o. female is here for an colonoscopy.   Past Medical History:  Diagnosis Date   Chicken pox     Past Surgical History:  Procedure Laterality Date   CESAREAN SECTION     FINGER SURGERY     KNEE ARTHROSCOPY Left 09/22/1982    Prior to Admission medications   Medication Sig Start Date End Date Taking? Authorizing Provider  Calcium-Magnesium-Vitamin D (CALCIUM 1200+D3 PO) Take by mouth. Patient not taking: Reported on 12/31/2021    [provider]  Magnesium Citrate 200 MG TABS Take by mouth.    [provider]  Multiple Vitamin (MULTIVITAMIN) capsule Take 1 capsule by mouth daily.    [provider]  Naproxen Sodium (ALEVE PO) Take 1 tablet by mouth daily.    [provider]  nitrofurantoin, macrocrystal-monohydrate, (MACROBID) 100 MG capsule Take 1 capsule (100 mg total) by mouth 2 (two) times daily. 12/31/21   Jearld Fenton, NP  rosuvastatin (CRESTOR) 10 MG tablet TAKE 1 TABLET BY MOUTH EVERY DAY 02/06/22   Jearld Fenton, NP    Allergies as of 02/06/2022 - Review Complete 12/31/2021  Allergen Reaction Noted   Codeine Nausea And Vomiting 02/24/2014    Family History  Problem Relation Age of Onset   Alcohol abuse Father    Stroke Father    Heart disease Father    Cancer Mother        bladder   Heart disease Maternal Uncle    Heart disease Maternal Grandfather    Diabetes Neg Hx     Social History   Socioeconomic History   Marital status: Married    Spouse name: Not on file   Number of children: Not on file   Years of education: Not on file   Highest education level: Not on file  Occupational History   Not on file  Tobacco Use    Smoking status: Every Day    Packs/day: 1.00    Types: Cigarettes   Smokeless tobacco: Never  Vaping Use   Vaping Use: Former  Substance and Sexual Activity   Alcohol use: Yes    Alcohol/week: 1.0 standard drink    Types: 1 Glasses of wine per week    Comment: rare--wine   Drug use: No   Sexual activity: Not Currently  Other Topics Concern   Not on file  Social History Narrative   Not on file   Social Determinants of Health   Financial Resource Strain: Not on file  Food Insecurity: Not on file  Transportation Needs: Not on file  Physical Activity: Not on file  Stress: Not on file  Social Connections: Not on file  Intimate Partner Violence: Not on file    Review of Systems: See HPI, otherwise negative ROS  Physical Exam: BP 116/74   Pulse (!) 102   Temp (!) 97.5 F (36.4 C) (Temporal)   Resp 16   Ht 5' 2.5" (1.588 m)   Wt 58.1 kg   SpO2 98%   BMI 23.04 kg/m  General:   Alert,  pleasant and cooperative in NAD Head:  Normocephalic and atraumatic. Neck:  Supple; no masses or thyromegaly. Lungs:  Clear throughout to auscultation.  Heart:  Regular rate and rhythm. Abdomen:  Soft, nontender and nondistended. Normal bowel sounds, without guarding, and without rebound.   Neurologic:  Alert and  oriented x4;  grossly normal neurologically.  Impression/Plan: Diana Bruce is here for an colonoscopy to be performed for cologaurd positive  Risks, benefits, limitations, and alternatives regarding  colonoscopy have been reviewed with the patient.  Questions have been answered.  All parties agreeable.   Sherri Sear, MD  02/25/2022, 9:16 AM

## 2022-02-25 NOTE — Op Note (Signed)
[facility] Patient Name: Diana Bruce Procedure Date: 02/25/2022 10:00 AM MRN: [id number] Account #: [id number] Date of Birth: August 31, 1966 Admit Type: Outpatient Age: 56 Room: [facility] ENDO ROOM 3 Gender: Female Note Status: Finalized Instrument Name: Colonoscope [id number] Procedure:             Colonoscopy Indications:           This is the patient's first colonoscopy, Positive                         Cologuard test Providers:             Lin Landsman MD, MD Referring MD:          Jearld Fenton (Referring MD) Medicines:             General Anesthesia Complications:         No immediate complications. Estimated blood loss: None. Procedure:             Pre-Anesthesia Assessment:                        - Prior to the procedure, a History and Physical was                         performed, and patient medications and allergies were                         reviewed. The patient is competent. The risks and                         benefits of the procedure and the sedation options and                         risks were discussed with the patient. All questions                         were answered and informed consent was obtained.                         Patient identification and proposed procedure were                         verified by the physician, the nurse, the                         anesthesiologist, the anesthetist and the technician                         in the pre-procedure area in the procedure room in the                         endoscopy suite. Mental Status Examination: alert and                         oriented. Airway Examination: normal oropharyngeal                         airway and neck mobility. Respiratory Examination:  clear to auscultation. CV Examination: normal.                         Prophylactic Antibiotics: The patient does not require                         prophylactic antibiotics. Prior  Anticoagulants: The                         patient has taken no previous anticoagulant or                         antiplatelet agents. ASA Grade Assessment: I - A                         normal, healthy patient. After reviewing the risks and                         benefits, the patient was deemed in satisfactory                         condition to undergo the procedure. The anesthesia                         plan was to use general anesthesia. Immediately prior                         to administration of medications, the patient was                         re-assessed for adequacy to receive sedatives. The                         heart rate, respiratory rate, oxygen saturations,                         blood pressure, adequacy of pulmonary ventilation, and                         response to care were monitored throughout the                         procedure. The physical status of the patient was                         re-assessed after the procedure.                        After obtaining informed consent, the colonoscope was                         passed under direct vision. Throughout the procedure,                         the patient's blood pressure, pulse, and oxygen                         saturations were monitored continuously. The  Colonoscope was introduced through the anus and                         advanced to the the cecum, identified by appendiceal                         orifice and ileocecal valve. The colonoscopy was                         unusually difficult due to multiple diverticula in the                         colon. Successful completion of the procedure was                         aided by withdrawing the scope and replacing with the                         pediatric colonoscope. The patient tolerated the                         procedure well. The quality of the bowel preparation                         was evaluated using the  BBPS [facility] Bowel Preparation                         Scale) with scores of: Right Colon = 3, Transverse                         Colon = 3 and Left Colon = 3 (entire mucosa seen well                         with no residual staining, small fragments of stool or                         opaque liquid). The total BBPS score equals 9. Findings:      The perianal and digital rectal examinations were normal. Pertinent       negatives include normal sphincter tone and no palpable rectal lesions.      Two sessile polyps were found in the descending colon and ascending       colon. The polyps were diminutive in size. These polyps were removed       with a jumbo cold forceps. Resection and retrieval were complete.      A 12 mm polyp was found in the recto-sigmoid colon. The polyp was       pedunculated. The polyp was removed with a hot snare. Resection and       retrieval were complete.      The retroflexed view of the distal rectum and anal verge was normal and       showed no anal or rectal abnormalities.      Multiple small and large-mouthed diverticula were found in the       recto-sigmoid colon and sigmoid colon. There was no evidence of       diverticular bleeding.      Two sessile polyps were found in the rectum.  The polyps were 4 to 5 mm       in size. These polyps were removed with a cold snare. Resection and       retrieval were complete. Impression:            - Two diminutive polyps in the descending colon and in                         the ascending colon, removed with a jumbo cold                         forceps. Resected and retrieved.                        - One 12 mm polyp at the recto-sigmoid colon, removed                         with a hot snare. Resected and retrieved.                        - The distal rectum and anal verge are normal on                         retroflexion view.                        - Severe diverticulosis in the recto-sigmoid colon and                          in the sigmoid colon. There was no evidence of                         diverticular bleeding.                        - Two 4 to 5 mm polyps in the rectum, removed with a                         cold snare. Resected and retrieved. Recommendation:        - Discharge patient to home (with escort).                        - Resume previous diet today.                        - Continue present medications.                        - Await pathology results.                        - Repeat colonoscopy in 3 - 5 years for surveillance                         based on pathology results. Procedure Code(s):     --- Professional ---                        [phone], Colonoscopy, flexible; with removal of  tumor(s), polyp(s), or other lesion(s) by snare                         technique                        [id number], 59, Colonoscopy, flexible; with biopsy, single                         or multiple Diagnosis Code(s):     --- Professional ---                        K63.5, Polyp of colon                        K62.1, Rectal polyp                        R19.5, Other fecal abnormalities                        K57.30, Diverticulosis of large intestine without                         perforation or abscess without bleeding CPT copyright 2019 American Medical Association. All rights reserved. The codes documented in this report are preliminary and upon coder review may  be revised to meet current compliance requirements. Dr. Ulyess Mort Lin Landsman MD, MD 02/25/2022 10:50:44 AM This report has been signed electronically. Number of Addenda: 0 Note Initiated On: 02/25/2022 10:00 AM Scope Withdrawal Time: 0 hours 14 minutes 40 seconds  Total Procedure Duration: 0 hours 25 minutes 39 seconds  Estimated Blood Loss:  Estimated blood loss: none. Estimated blood loss: none.      [facility]

## 2022-02-26 ENCOUNTER — Encounter: Payer: Self-pay | Admitting: Gastroenterology

## 2022-02-26 LAB — SURGICAL PATHOLOGY

## 2022-03-21 ENCOUNTER — Other Ambulatory Visit: Payer: Self-pay

## 2022-03-21 ENCOUNTER — Emergency Department
Admission: EM | Admit: 2022-03-21 | Discharge: 2022-03-21 | Discharge status: Against Medical Advice | Payer: BC Managed Care – PPO | Attending: Emergency Medicine | Admitting: Emergency Medicine

## 2022-03-21 ENCOUNTER — Encounter: Payer: Self-pay | Admitting: Emergency Medicine

## 2022-03-21 ENCOUNTER — Emergency Department: Payer: BC Managed Care – PPO

## 2022-03-21 DIAGNOSIS — M25511 Pain in right shoulder: Secondary | ICD-10-CM | POA: Diagnosis present

## 2022-03-21 DIAGNOSIS — Z5321 Procedure and treatment not carried out due to patient leaving prior to being seen by health care provider: Secondary | ICD-10-CM | POA: Diagnosis not present

## 2022-03-21 DIAGNOSIS — M542 Cervicalgia: Secondary | ICD-10-CM | POA: Diagnosis not present

## 2022-03-21 DIAGNOSIS — R0789 Other chest pain: Secondary | ICD-10-CM | POA: Diagnosis not present

## 2022-03-21 LAB — CBC
HCT: 40.8 % (ref 36.0–46.0)
Hemoglobin: 13.4 g/dL (ref 12.0–15.0)
MCH: 29.6 pg (ref 26.0–34.0)
MCHC: 32.8 g/dL (ref 30.0–36.0)
MCV: 90.3 fL (ref 80.0–100.0)
Platelets: 254 10*3/uL (ref 150–400)
RBC: 4.52 MIL/uL (ref 3.87–5.11)
RDW: 12.4 % (ref 11.5–15.5)
WBC: 9.6 10*3/uL (ref 4.0–10.5)
nRBC: 0 % (ref 0.0–0.2)

## 2022-03-21 LAB — BASIC METABOLIC PANEL
Anion gap: 7 (ref 5–15)
BUN: 15 mg/dL (ref 6–20)
CO2: 26 mmol/L (ref 22–32)
Calcium: 9.3 mg/dL (ref 8.9–10.3)
Chloride: 106 mmol/L (ref 98–111)
Creatinine, Ser: 0.93 mg/dL (ref 0.44–1.00)
GFR, Estimated: >60 mL/min (ref >60)
Glucose, Bld: 107 mg/dL — ABNORMAL HIGH (ref 70–99)
Potassium: 3.7 mmol/L (ref 3.5–5.1)
Sodium: 139 mmol/L (ref 135–145)

## 2022-03-21 LAB — TROPONIN I (HIGH SENSITIVITY): Troponin I (High Sensitivity): 4 ng/L (ref <18)

## 2022-03-21 NOTE — ED Triage Notes (Signed)
Pt was sitting in her room and had sudden onset of right shoulder and neck pain and feeling that "she couldn't get enough air"  Pt went to fire department and no one was there so came in to get checked. Tightness in chest.

## 2022-03-21 NOTE — ED Notes (Signed)
Pt states she if feeling better, pt encouraged to stay and be seen, pt states she will return if she worsens.

## 2022-05-13 ENCOUNTER — Other Ambulatory Visit: Payer: Self-pay | Admitting: Internal Medicine

## 2022-05-13 NOTE — Telephone Encounter (Signed)
Due for OV- sent pt MyChart message to call office to make appt.  Last RF 02/06/22 #90  Overdue lab work in 3 days  Requested Prescriptions  Pending Prescriptions Disp Refills   rosuvastatin (CRESTOR) 10 MG tablet [Pharmacy Med Name: ROSUVASTATIN CALCIUM 10 MG TAB] 90 tablet 0    Sig: TAKE 1 TABLET BY MOUTH EVERY DAY     Cardiovascular:  Antilipid - Statins 2 Failed - 05/13/2022  2:08 AM      Failed - Lipid Panel in normal range within the last 12 months    Cholesterol  Date Value Ref Range Status  05/17/2021 165 <200 mg/dL Final   LDL Cholesterol (Calc)  Date Value Ref Range Status  05/17/2021 93 mg/dL (calc) Final    Comment:    Reference range: <100 . Desirable range <100 mg/dL for primary prevention;   <70 mg/dL for patients with CHD or diabetic patients  with > or = 2 CHD risk factors. Marland Kitchen LDL-C is now calculated using the Martin-Hopkins  calculation, which is a validated novel method providing  better accuracy than the Friedewald equation in the  estimation of LDL-C.  Cresenciano Genre et al. Annamaria Helling. 1324;401(02): 2061-2068  (http://education.[facility].com/faq/FAQ164)    HDL  Date Value Ref Range Status  05/17/2021 53 > OR = 50 mg/dL Final   Triglycerides  Date Value Ref Range Status  05/17/2021 92 <150 mg/dL Final         Passed - Cr in normal range and within 360 days    Creat  Date Value Ref Range Status  05/14/2021 0.76 0.50 - 1.03 mg/dL Final   Creatinine, Ser  Date Value Ref Range Status  03/21/2022 0.93 0.44 - 1.00 mg/dL Final         Passed - Patient is not pregnant      Passed - Valid encounter within last 12 months    Recent Outpatient Visits           4 months ago [facility] [place], Coralie Keens, NP   12 months ago Encounter for general adult medical examination with abnormal findings   [facility] [place], Coralie Keens, NP

## 2022-06-08 ENCOUNTER — Other Ambulatory Visit: Payer: Self-pay | Admitting: Internal Medicine

## 2022-06-09 NOTE — Telephone Encounter (Signed)
Requested medication (s) are due for refill today: yes  Requested medication (s) are on the active medication list: yes  Last refill:  05/14/22 #30 with 0 RF  Future visit scheduled: no, last visit 12/31/21  Notes to clinic:  Failed protocol of labs within 12 months, labs 04/2021, no upcoming appt, VM left with no response, please assess.       Requested Prescriptions  Pending Prescriptions Disp Refills   rosuvastatin (CRESTOR) 10 MG tablet [Pharmacy Med Name: ROSUVASTATIN CALCIUM 10 MG TAB] 30 tablet 0    Sig: TAKE 1 TABLET BY MOUTH EVERY DAY     Cardiovascular:  Antilipid - Statins 2 Failed - 06/08/2022  2:30 PM      Failed - Lipid Panel in normal range within the last 12 months    Cholesterol  Date Value Ref Range Status  05/17/2021 165 <200 mg/dL Final   LDL Cholesterol (Calc)  Date Value Ref Range Status  05/17/2021 93 mg/dL (calc) Final    Comment:    Reference range: <100 . Desirable range <100 mg/dL for primary prevention;   <70 mg/dL for patients with CHD or diabetic patients  with > or = 2 CHD risk factors. Marland Kitchen LDL-C is now calculated using the Martin-Hopkins  calculation, which is a validated novel method providing  better accuracy than the Friedewald equation in the  estimation of LDL-C.  Cresenciano Genre et al. Annamaria Helling. 7619;509(32): 2061-2068  (http://education.[facility].com/faq/FAQ164)    HDL  Date Value Ref Range Status  05/17/2021 53 > OR = 50 mg/dL Final   Triglycerides  Date Value Ref Range Status  05/17/2021 92 <150 mg/dL Final         Passed - Cr in normal range and within 360 days    Creat  Date Value Ref Range Status  05/14/2021 0.76 0.50 - 1.03 mg/dL Final   Creatinine, Ser  Date Value Ref Range Status  03/21/2022 0.93 0.44 - 1.00 mg/dL Final         Passed - Patient is not pregnant      Passed - Valid encounter within last 12 months    Recent Outpatient Visits           5 months ago [facility] [place],  Coralie Keens, NP   1 year ago Encounter for general adult medical examination with abnormal findings   [facility] [place], Coralie Keens, NP

## 2022-06-09 NOTE — Telephone Encounter (Signed)
Voicemail left to call and schedule OV with labs.

## 2022-12-09 ENCOUNTER — Encounter: Payer: Self-pay | Admitting: Internal Medicine

## 2022-12-09 ENCOUNTER — Ambulatory Visit (INDEPENDENT_AMBULATORY_CARE_PROVIDER_SITE_OTHER): Payer: BC Managed Care – PPO | Admitting: Internal Medicine

## 2022-12-09 VITALS — BP 130/82 | HR 80 | Temp 96.8°F | Ht 62.75 in | Wt 131.0 lb

## 2022-12-09 DIAGNOSIS — E7800 Pure hypercholesterolemia, unspecified: Secondary | ICD-10-CM

## 2022-12-09 DIAGNOSIS — Z0001 Encounter for general adult medical examination with abnormal findings: Secondary | ICD-10-CM | POA: Diagnosis not present

## 2022-12-09 DIAGNOSIS — Z1231 Encounter for screening mammogram for malignant neoplasm of breast: Secondary | ICD-10-CM

## 2022-12-09 DIAGNOSIS — Z122 Encounter for screening for malignant neoplasm of respiratory organs: Secondary | ICD-10-CM

## 2022-12-09 DIAGNOSIS — M159 Polyosteoarthritis, unspecified: Secondary | ICD-10-CM

## 2022-12-09 DIAGNOSIS — M150 Primary generalized (osteo)arthritis: Secondary | ICD-10-CM

## 2022-12-09 MED ORDER — ROSUVASTATIN CALCIUM 10 MG PO TABS: 10.0000 mg | ORAL_TABLET | Freq: Every day | ORAL | 3 refills | Status: DC

## 2022-12-09 NOTE — Assessment & Plan Note (Signed)
Continue Naproxen as needed

## 2022-12-09 NOTE — Patient Instructions (Signed)

## 2022-12-09 NOTE — Assessment & Plan Note (Signed)
CMET and lipid profile today Encouraged her to consume a low fat diet Continue rosuvastatin, refilled today

## 2022-12-09 NOTE — Progress Notes (Signed)
Subjective:    Patient ID: Diana Bruce, female    DOB: 1966-01-25, 57 y.o.   MRN: [id number]  HPI  Patient presents to clinic today for her annual.  She is also due to follow-up chronic conditions.  HLD: Her last LDL was 92, triglycerides 93, 05/2022.  She denies myalgias on Rosuvastatin.  She tries to consume low-fat diet.  OA: Mainly in her hands and knees.  She takes Naproxen as needed with some relief of symptoms.  She does not follow with orthopedics.  Flu: 07/2014 Tetanus: 04/2019 COVID: Never Shingrix: 11/2019, 02/2020 Pap smear: 04/2019 Mammogram: 04/2019 Colon screening: 02/2022 Vision screening: annually Dentist: as needed, dentures  Diet: She does eat meat. She consumes fruits and veggies. She does eat some fried foods. She drinks mostly soda and coffee. Exercise: None  Review of Systems  Past Medical History:  Diagnosis Date   Chicken pox     Current Outpatient Medications  Medication Sig Dispense Refill   Magnesium Citrate 200 MG TABS Take by mouth.     Multiple Vitamin (MULTIVITAMIN) capsule Take 1 capsule by mouth daily.     Naproxen Sodium (ALEVE PO) Take 1 tablet by mouth daily.     nitrofurantoin, macrocrystal-monohydrate, (MACROBID) 100 MG capsule Take 1 capsule (100 mg total) by mouth 2 (two) times daily. 10 capsule 0   rosuvastatin (CRESTOR) 10 MG tablet TAKE 1 TABLET BY MOUTH EVERY DAY 30 tablet 0   No current facility-administered medications for this visit.    Allergies  Allergen Reactions   Codeine Nausea And Vomiting    Family History  Problem Relation Age of Onset   Alcohol abuse Father    Stroke Father    Heart disease Father    Cancer Mother        bladder   Heart disease Maternal Uncle    Heart disease Maternal Grandfather    Diabetes Neg Hx     Social History   Socioeconomic History   Marital status: Married    Spouse name: Not on file   Number of children: Not on file   Years of education: Not on file   Highest education  level: Not on file  Occupational History   Not on file  Tobacco Use   Smoking status: Every Day    Packs/day: 1    Types: Cigarettes   Smokeless tobacco: Never  Vaping Use   Vaping Use: Former  Substance and Sexual Activity   Alcohol use: Yes    Alcohol/week: 1.0 standard drink of alcohol    Types: 1 Glasses of wine per week    Comment: rare--wine   Drug use: No   Sexual activity: Not Currently  Other Topics Concern   Not on file  Social History Narrative   Not on file   Social Determinants of Health   Financial Resource Strain: Not on file  Food Insecurity: Not on file  Transportation Needs: Not on file  Physical Activity: Not on file  Stress: Not on file  Social Connections: Not on file  Intimate Partner Violence: Not on file     Constitutional: Denies fever, malaise, fatigue, headache or abrupt weight changes.  HEENT: Denies eye pain, eye redness, ear pain, ringing in the ears, wax buildup, runny nose, nasal congestion, bloody nose, or sore throat. Respiratory: Denies difficulty breathing, shortness of breath, cough or sputum production.   Cardiovascular: Denies chest pain, chest tightness, palpitations or swelling in the hands or feet.  Gastrointestinal: Denies abdominal pain,  bloating, constipation, diarrhea or blood in the stool.  GU: Denies urgency, frequency, pain with urination, burning sensation, blood in urine, odor or discharge. Musculoskeletal: Patient reports joint pain.  Denies decrease in range of motion, difficulty with gait, muscle pain or joint swelling.  Skin: Denies redness, rashes, lesions or ulcercations.  Neurological: Denies dizziness, difficulty with memory, difficulty with speech or problems with balance and coordination.  Psych: Denies anxiety, depression, SI/HI.  No other specific complaints in a complete review of systems (except as listed in HPI above).     Objective:   Physical Exam  BP 130/82 (BP Location: Right Arm, Patient  Position: Sitting, Cuff Size: Normal)   Pulse 80   Temp (!) 96.8 F (36 C) (Temporal)   Ht 5' 2.75" (1.594 m)   Wt 131 lb (59.4 kg)   SpO2 98%   BMI 23.39 kg/m   Wt Readings from Last 3 Encounters:  02/25/22 128 lb (58.1 kg)  12/31/21 132 lb (59.9 kg)  05/14/21 135 lb 12.8 oz (61.6 kg)    General: Appears her stated age, well developed, well nourished in NAD. Skin: Warm, dry and intact. No rashes noted. HEENT: Head: normal shape and size; Eyes: sclera white, no icterus, conjunctiva pink, PERRLA and EOMs intact;  Neck:  Neck supple, trachea midline. No masses, lumps or thyromegaly present.  Cardiovascular: Normal rate and rhythm. S1,S2 noted.  No murmur, rubs or gallops noted. No JVD or BLE edema. No carotid bruits noted. Pulmonary/Chest: Normal effort and positive vesicular breath sounds. No respiratory distress. No wheezes, rales or ronchi noted.  Abdomen: Normal bowel sounds.  Musculoskeletal: Strength 5/5 BUE/BLE. No signs of joint swelling. No difficulty with gait.  Neurological: Alert and oriented. Cranial nerves II-XII grossly intact. Coordination normal.  Psychiatric: Mood and affect normal. Behavior is normal. Judgment and thought content normal.    BMET    Component Value Date/Time   NA 139 03/21/2022 2213   K 3.7 03/21/2022 2213   CL 106 03/21/2022 2213   CO2 26 03/21/2022 2213   GLUCOSE 107 (H) 03/21/2022 2213   BUN 15 03/21/2022 2213   CREATININE 0.93 03/21/2022 2213   CREATININE 0.76 05/14/2021 1345   CALCIUM 9.3 03/21/2022 2213   GFRNONAA >60 03/21/2022 2213   GFRAA  07/11/2009 1045    >60        The eGFR has been calculated using the MDRD equation. This calculation has not been validated in all clinical situations. eGFR's persistently <60 mL/min signify possible Chronic Kidney Disease.    Lipid Panel     Component Value Date/Time   CHOL 165 05/17/2021 1016   TRIG 92 05/17/2021 1016   HDL 53 05/17/2021 1016   CHOLHDL 3.1 05/17/2021 1016   VLDL  24.0 08/03/2019 1314   LDLCALC 93 05/17/2021 1016    CBC    Component Value Date/Time   WBC 9.6 03/21/2022 2213   RBC 4.52 03/21/2022 2213   HGB 13.4 03/21/2022 2213   HCT 40.8 03/21/2022 2213   PLT 254 03/21/2022 2213   MCV 90.3 03/21/2022 2213   MCH 29.6 03/21/2022 2213   MCHC 32.8 03/21/2022 2213   RDW 12.4 03/21/2022 2213   LYMPHSABS 2.5 07/11/2009 1045   MONOABS 0.6 07/11/2009 1045   EOSABS 0.1 07/11/2009 1045   BASOSABS 0.1 07/11/2009 1045    Hgb A1C Lab Results  Component Value Date   HGBA1C 5.2 05/17/2021            Assessment & Plan:  Preventative Health Maintenance:  Encouraged her to get a flu shot in the fall Tetanus UTD Encouraged her to get her COVID-vaccine Shingrix UTD Pap smear UTD CT Lung cancer screening ordered Mammogram ordered-she will call to schedule Colon screening UTD Encouraged her to consume a balanced diet and exercise regimen Advised her to see an eye doctor and dentist annually We will check BC, c-Met, lipid profile today  RTC in 1 year, sooner if needed Webb Silversmith, NP

## 2022-12-10 LAB — COMPLETE METABOLIC PANEL WITH GFR
AG Ratio: 1.6 (calc) (ref 1.0–2.5)
ALT: 15 U/L (ref 6–29)
AST: 19 U/L (ref 10–35)
Albumin: 4.2 g/dL (ref 3.6–5.1)
Alkaline phosphatase (APISO): 76 U/L (ref 37–153)
BUN: 13 mg/dL (ref 7–25)
CO2: 29 mmol/L (ref 20–32)
Calcium: 9.7 mg/dL (ref 8.6–10.4)
Chloride: 107 mmol/L (ref 98–110)
Creat: 0.79 mg/dL (ref 0.50–1.03)
Globulin: 2.7 g/dL (calc) (ref 1.9–3.7)
Glucose, Bld: 90 mg/dL (ref 65–139)
Potassium: 4.5 mmol/L (ref 3.5–5.3)
Sodium: 142 mmol/L (ref 135–146)
Total Bilirubin: 0.4 mg/dL (ref 0.2–1.2)
Total Protein: 6.9 g/dL (ref 6.1–8.1)
eGFR: 88 mL/min/{1.73_m2} (ref >=60)

## 2022-12-10 LAB — CBC
HCT: 41.7 % (ref 35.0–45.0)
Hemoglobin: 13.9 g/dL (ref 11.7–15.5)
MCH: 29.9 pg (ref 27.0–33.0)
MCHC: 33.3 g/dL (ref 32.0–36.0)
MCV: 89.7 fL (ref 80.0–100.0)
MPV: 10.4 fL (ref 7.5–12.5)
Platelets: 266 10*3/uL (ref 140–400)
RBC: 4.65 10*6/uL (ref 3.80–5.10)
RDW: 11.9 % (ref 11.0–15.0)
WBC: 9.2 10*3/uL (ref 3.8–10.8)

## 2022-12-10 LAB — LIPID PANEL
Cholesterol: 178 mg/dL (ref <200)
HDL: 58 mg/dL (ref >=50)
LDL Cholesterol (Calc): 98 mg/dL (calc)
Non-HDL Cholesterol (Calc): 120 mg/dL (calc) (ref <130)
Total CHOL/HDL Ratio: 3.1 (calc) (ref <5.0)
Triglycerides: 127 mg/dL (ref <150)

## 2022-12-26 ENCOUNTER — Ambulatory Visit
Admission: RE | Admit: 2022-12-26 | Discharge: 2022-12-26 | Disposition: A | Discharge status: Home / Self Care | Payer: BC Managed Care – PPO | Source: Ambulatory Visit | Attending: Internal Medicine | Admitting: Internal Medicine

## 2022-12-26 DIAGNOSIS — Z1231 Encounter for screening mammogram for malignant neoplasm of breast: Secondary | ICD-10-CM | POA: Insufficient documentation

## 2023-01-01 ENCOUNTER — Ambulatory Visit
Admission: EM | Admit: 2023-01-01 | Discharge: 2023-01-01 | Disposition: A | Discharge status: Home / Self Care | Payer: BC Managed Care – PPO | Attending: Physician Assistant | Admitting: Physician Assistant

## 2023-01-01 VITALS — BP 124/64 | HR 86 | Temp 97.9°F | Resp 16

## 2023-01-01 DIAGNOSIS — S0501XA Injury of conjunctiva and corneal abrasion without foreign body, right eye, initial encounter: Secondary | ICD-10-CM | POA: Diagnosis not present

## 2023-01-01 MED ORDER — TOBRAMYCIN 0.3 % OP SOLN: 1.0000 [drp] | OPHTHALMIC | 0 refills | Status: AC

## 2023-01-01 NOTE — ED Triage Notes (Signed)
Pt states she woke up this morning with right eye pain.redness and watery. Denies any visual changes.

## 2023-01-01 NOTE — ED Provider Notes (Signed)
[facility]    CSN: [id number] Arrival date & time: 01/01/23  1200      History   Chief Complaint Chief Complaint  Patient presents with   Eye Pain    HPI Diana Bruce is a 57 y.o. female.   Pt complains of right eye irritation.  Pt feels like she has something in her eye.  Pt reports she was normal last pm and awoke with discomfort this am  The history is provided by the patient. No language interpreter was used.  Eye Pain This is a new problem. The current episode started 6 to 12 hours ago. The problem occurs constantly. The problem has not changed since onset.Nothing aggravates the symptoms. Nothing relieves the symptoms. She has tried nothing for the symptoms.    Past Medical History:  Diagnosis Date   Chicken pox     Patient Active Problem List   Diagnosis Date Noted   HLD (hyperlipidemia) 05/14/2021   OA (osteoarthritis) 05/14/2021    Past Surgical History:  Procedure Laterality Date   CESAREAN SECTION     COLONOSCOPY WITH PROPOFOL N/A 02/25/2022   Procedure: COLONOSCOPY WITH PROPOFOL;  Surgeon: Toney Reil, MD;  Location: [facility] END[facility]COPY;  Service: Gastroenterology;  Laterality: N/A;   FINGER SURGERY     KNEE ARTHROSCOPY Left 09/22/1982    OB History   No obstetric history on file.      Home Medications    Prior to Admission medications   Medication Sig Start Date End Date Taking? Authorizing Provider  tobramycin (TOBREX) 0.3 % ophthalmic solution Place 1 drop into the right eye every 4 (four) hours for 10 days. 01/01/23 01/11/23 Yes Elson Areas, PA-C  Naproxen Sodium (ALEVE PO) Take 1 tablet by mouth daily.    [provider]  rosuvastatin (CRESTOR) 10 MG tablet Take 1 tablet (10 mg total) by mouth daily. 12/09/22   Lorre Munroe, NP    Family History Family History  Problem Relation Age of Onset   Alcohol abuse Father    Stroke Father    Heart disease Father    Cancer Mother        bladder   Heart disease  Maternal Uncle    Heart disease Maternal Grandfather    Diabetes Neg Hx     Social History Social History   Tobacco Use   Smoking status: Every Day    Packs/day: 1    Types: Cigarettes   Smokeless tobacco: Never  Vaping Use   Vaping Use: Former  Substance Use Topics   Alcohol use: Yes    Alcohol/week: 1.0 standard drink of alcohol    Types: 1 Glasses of wine per week    Comment: rare--wine   Drug use: No     Allergies   Codeine   Review of Systems Review of Systems  Eyes:  Positive for pain.  All other systems reviewed and are negative.    Physical Exam Triage Vital Signs ED Triage Vitals [01/01/23 1229]  Enc Vitals Group     BP 124/64     Pulse Rate 86     Resp 16     Temp 97.9 F (36.6 C)     Temp Source Oral     SpO2 96 %     Weight      Height      Head Circumference      Peak Flow      Pain Score 4     Pain Loc  Pain Edu?      Excl. in GC?    No data found.  Updated Vital Signs BP 124/64 (BP Location: Left Arm)   Pulse 86   Temp 97.9 F (36.6 C) (Oral)   Resp 16   SpO2 96%   Visual Acuity Right Eye Distance:   Left Eye Distance:   Bilateral Distance:    Right Eye Near:   Left Eye Near:    Bilateral Near:     Physical Exam Vitals reviewed.  Constitutional:      Appearance: Normal appearance.  HENT:     Mouth/Throat:     Mouth: Mucous membranes are moist.  Eyes:     Extraocular Movements: Extraocular movements intact.     Pupils: Pupils are equal, round, and reactive to light.     Comments: Injected right conjunctiva  fluro uptake 3:00  eyelids swabbed, no foreign body   Skin:    General: Skin is warm.  Neurological:     General: No focal deficit present.     Mental Status: She is alert.  Psychiatric:        Mood and Affect: Mood normal.      UC Treatments / Results  Labs (all labs ordered are listed, but only abnormal results are displayed) Labs Reviewed - No data to display  EKG   Radiology No results  found.  Procedures Procedures (including critical care time)  Medications Ordered in UC Medications - No data to display  Initial Impression / Assessment and Plan / UC Course  I have reviewed the triage vital signs and the nursing notes.  Pertinent labs & imaging results that were available during my care of the patient were reviewed by me and considered in my medical decision making (see chart for details).      Final Clinical Impressions(s) / UC Diagnoses   Final diagnoses:  Abrasion of right cornea, initial encounter   Discharge Instructions   None    ED Prescriptions     Medication Sig Dispense Auth. Provider   tobramycin (TOBREX) 0.3 % ophthalmic solution Place 1 drop into the right eye every 4 (four) hours for 10 days. 5 mL Elson Areas, [place]      PDMP not reviewed this encounter. An After Visit Summary was printed and given to the patient.    Elson Areas, [place] 01/01/23 1305

## 2023-02-26 ENCOUNTER — Other Ambulatory Visit: Payer: Self-pay

## 2023-02-26 DIAGNOSIS — F17210 Nicotine dependence, cigarettes, uncomplicated: Secondary | ICD-10-CM

## 2023-02-26 DIAGNOSIS — Z87891 Personal history of nicotine dependence: Secondary | ICD-10-CM

## 2023-03-25 ENCOUNTER — Ambulatory Visit (INDEPENDENT_AMBULATORY_CARE_PROVIDER_SITE_OTHER): Payer: BC Managed Care – PPO | Admitting: Acute Care

## 2023-03-25 ENCOUNTER — Encounter: Payer: Self-pay | Admitting: Acute Care

## 2023-03-25 ENCOUNTER — Ambulatory Visit
Admission: RE | Admit: 2023-03-25 | Discharge: 2023-03-25 | Disposition: A | Discharge status: Home / Self Care | Payer: BC Managed Care – PPO | Source: Ambulatory Visit | Attending: Internal Medicine | Admitting: Internal Medicine

## 2023-03-25 DIAGNOSIS — F17210 Nicotine dependence, cigarettes, uncomplicated: Secondary | ICD-10-CM

## 2023-03-25 DIAGNOSIS — Z87891 Personal history of nicotine dependence: Secondary | ICD-10-CM | POA: Insufficient documentation

## 2023-03-25 NOTE — Progress Notes (Signed)
Virtual Visit via Telephone Note  I connected with Diana Bruce on 03/25/23 at 10:30 AM EDT by telephone and verified that I am speaking with the correct person using two identifiers.  Location: Patient:  At home Provider: [address] [place], [place], [address]    I discussed the limitations, risks, security and privacy concerns of performing an evaluation and management service by telephone and the availability of in person appointments. I also discussed with the patient that there may be a patient responsible charge related to this service. The patient expressed understanding and agreed to proceed.    Shared Decision Making Visit Lung Cancer Screening Program [phone])   Eligibility: Age 57 y.o. Pack Years Smoking History Calculation 38 pack year smoking history (# packs/per year x # years smoked) Recent History of coughing up blood  no Unexplained weight loss? no ( >Than 15 pounds within the last 6 months ) Prior History Lung / other cancer no (Diagnosis within the last 5 years already requiring surveillance chest CT Scans). Smoking Status Current Former Smokers: Years since quit:   Quit Date:   Visit Components: Discussion included one or more decision making aids. yes Discussion included risk/benefits of screening. yes Discussion included potential follow up diagnostic testing for abnormal scans. yes Discussion included meaning and risk of over diagnosis. yes Discussion included meaning and risk of False Positives. yes Discussion included meaning of total radiation exposure. yes  Counseling Included: Importance of adherence to annual lung cancer LDCT screening. yes Impact of comorbidities on ability to participate in the program. yes Ability and willingness to under diagnostic treatment. yes  Smoking Cessation Counseling: Current Smokers:  Discussed importance of smoking cessation. yes Information about tobacco cessation classes and interventions provided to  patient. yes Patient provided with "ticket" for LDCT Scan. yes Symptomatic Patient. no  Counseling NA Diagnosis Code: Tobacco Use Z72.0 Asymptomatic Patient yes  Counseling (Intermediate counseling: > three minutes counseling) B1478 Former Smokers:  Discussed the importance of maintaining cigarette abstinence. yes Diagnosis Code: Personal History of Nicotine Dependence. G95.621 Information about tobacco cessation classes and interventions provided to patient. Yes Patient provided with "ticket" for LDCT Scan. yes Written Order for Lung Cancer Screening with LDCT placed in Epic. Yes (CT Chest Lung Cancer Screening Low Dose W/O CM) [id number] Z12.2-Screening of respiratory organs Z87.891-Personal history of nicotine dependence  I have spent 25 minutes of face to face/ virtual visit   time with  Diana Bruce discussing the risks and benefits of lung cancer screening. We viewed / discussed a power point together that explained in detail the above noted topics. We paused at intervals to allow for questions to be asked and answered to ensure understanding.We discussed that the single most powerful action that she can take to decrease her risk of developing lung cancer is to quit smoking. We discussed whether or not she is ready to commit to setting a quit date. We discussed options for tools to aid in quitting smoking including nicotine replacement therapy, non-nicotine medications, support groups, Quit Smart classes, and behavior modification. We discussed that often times setting smaller, more achievable goals, such as eliminating 1 cigarette a day for a week and then 2 cigarettes a day for a week can be helpful in slowly decreasing the number of cigarettes smoked. This allows for a sense of accomplishment as well as providing a clinical benefit. I provided  her  with smoking cessation  information  with contact information for community resources, classes, free nicotine replacement  therapy, and access to  mobile apps, text messaging, and on-line smoking cessation help. I have also provided  her  the office contact information in the event she needs to contact me, or the screening staff. We discussed the time and location of the scan, and that either Diana Miyamoto RN, Diana Lemon, RN  or I will call / send a letter with the results within 24-72 hours of receiving them. The patient verbalized understanding of all of  the above and had no further questions upon leaving the office. They have my contact information in the event they have any further questions.  I spent 3-4 minutes counseling on smoking cessation and the health risks of continued tobacco abuse.  I explained to the patient that there has been a high incidence of coronary artery disease noted on these exams. I explained that this is a non-gated exam therefore degree or severity cannot be determined. This patient is on statin therapy. I have asked the patient to follow-up with their PCP regarding any incidental finding of coronary artery disease and management with diet or medication as their PCP  feels is clinically indicated. The patient verbalized understanding of the above and had no further questions upon completion of the visit.      [name], NP 03/25/2023

## 2023-03-25 NOTE — Patient Instructions (Signed)
Thank you for participating in the [facility] Lung Cancer Screening Program. It was our pleasure to meet you today. We will call you with the results of your scan within the next few days. Your scan will be assigned a Lung RADS category score by the physicians reading the scans.  This Lung RADS score determines follow up scanning.  See below for description of categories, and follow up screening recommendations. We will be in touch to schedule your follow up screening annually or based on recommendations of our providers. We will fax a copy of your scan results to your Primary Care Physician, or the physician who referred you to the program, to ensure they have the results. Please call the office if you have any questions or concerns regarding your scanning experience or results.  Our office number is [phone]. Please speak with Denise Phelps, RN. , or  Denise Buckner RN, They are  our Lung Cancer Screening RN.'s If They are unavailable when you call, Please leave a message on the voice mail. We will return your call at our earliest convenience.This voice mail is monitored several times a day.  Remember, if your scan is normal, we will scan you annually as long as you continue to meet the criteria for the program. (Age 50-80, Current smoker or smoker who has quit within the last 15 years). If you are a smoker, remember, quitting is the single most powerful action that you can take to decrease your risk of lung cancer and other pulmonary, breathing related problems. We know quitting is hard, and we are here to help.  Please let us know if there is anything we can do to help you meet your goal of quitting. If you are a former smoker, congratulations. We are proud of you! Remain smoke free! Remember you can refer friends or family members through the number above.  We will screen them to make sure they meet criteria for the program. Thank you for helping us take better care of you by  participating in Lung Screening.  You can receive free nicotine replacement therapy ( patches, gum or mints) by calling 1-800-QUIT NOW. Please call so we can get you on the path to becoming  a non-smoker. I know it is hard, but you can do this!  Lung RADS Categories:  Lung RADS 1: no nodules or definitely non-concerning nodules.  Recommendation is for a repeat annual scan in 12 months.  Lung RADS 2:  nodules that are non-concerning in appearance and behavior with a very low likelihood of becoming an active cancer. Recommendation is for a repeat annual scan in 12 months.  Lung RADS 3: nodules that are probably non-concerning , includes nodules with a low likelihood of becoming an active cancer.  Recommendation is for a 6-month repeat screening scan. Often noted after an upper respiratory illness. We will be in touch to make sure you have no questions, and to schedule your 6-month scan.  Lung RADS 4 A: nodules with concerning findings, recommendation is most often for a follow up scan in 3 months or additional testing based on our provider's assessment of the scan. We will be in touch to make sure you have no questions and to schedule the recommended 3 month follow up scan.  Lung RADS 4 B:  indicates findings that are concerning. We will be in touch with you to schedule additional diagnostic testing based on our provider's  assessment of the scan.  Other options for assistance in smoking cessation (   As covered by your insurance benefits)  Hypnosis for smoking cessation  [employer] [phone]  Acupuncture for smoking cessation  [facility] [phone]   

## 2023-03-30 ENCOUNTER — Telehealth: Payer: Self-pay | Admitting: Acute Care

## 2023-03-30 DIAGNOSIS — Z87891 Personal history of nicotine dependence: Secondary | ICD-10-CM

## 2023-03-30 DIAGNOSIS — F17210 Nicotine dependence, cigarettes, uncomplicated: Secondary | ICD-10-CM

## 2023-03-30 DIAGNOSIS — Z122 Encounter for screening for malignant neoplasm of respiratory organs: Secondary | ICD-10-CM

## 2023-03-30 NOTE — Telephone Encounter (Signed)
Called and spoke to patient regarding the results of her LDCT. There was mention of lesions on liver parenchyma and abdominal MRI is recommended, results below. Gave patient these recommendations and informed her we would send her results and recommendations from radiologist to her PCP, Nicki Reaper. Advised patient to follow up with her PCP if they do not contact her in 7-10 days. Order placed for 12 month repeat LDCT. Pt verbalized understanding and denied any further questions or concerns at this time.     ADDENDUM: As mentioned in the original dictation, but omitted from the impression section, there are multiple small low-intermediate attenuation lesions scattered throughout the visualized hepatic parenchyma. These are incompletely characterized on today's noncontrast CT examination. Although the liver appears heterogeneous on remote prior CT of the abdomen and pelvis from 2010, further evaluation with follow-up nonemergent abdominal MRI with and without IV gadolinium is recommended in the near future to better characterize these findings and exclude the possibility of malignancy.

## 2023-04-08 ENCOUNTER — Encounter: Payer: Self-pay | Admitting: Internal Medicine

## 2023-04-09 NOTE — Telephone Encounter (Signed)
Did you order a CT?   Thanks,   -Vernona Rieger

## 2023-12-11 ENCOUNTER — Ambulatory Visit (INDEPENDENT_AMBULATORY_CARE_PROVIDER_SITE_OTHER): Payer: BC Managed Care – PPO | Admitting: Internal Medicine

## 2023-12-11 ENCOUNTER — Encounter: Payer: Self-pay | Admitting: Internal Medicine

## 2023-12-11 VITALS — BP 130/72 | HR 92 | Ht 62.75 in | Wt 129.6 lb

## 2023-12-11 DIAGNOSIS — Z0001 Encounter for general adult medical examination with abnormal findings: Secondary | ICD-10-CM | POA: Diagnosis not present

## 2023-12-11 DIAGNOSIS — M150 Primary generalized (osteo)arthritis: Secondary | ICD-10-CM

## 2023-12-11 DIAGNOSIS — J432 Centrilobular emphysema: Secondary | ICD-10-CM | POA: Diagnosis not present

## 2023-12-11 DIAGNOSIS — R739 Hyperglycemia, unspecified: Secondary | ICD-10-CM

## 2023-12-11 DIAGNOSIS — Z23 Encounter for immunization: Secondary | ICD-10-CM

## 2023-12-11 DIAGNOSIS — Z1231 Encounter for screening mammogram for malignant neoplasm of breast: Secondary | ICD-10-CM | POA: Diagnosis not present

## 2023-12-11 DIAGNOSIS — I700 Atherosclerosis of aorta: Secondary | ICD-10-CM

## 2023-12-11 DIAGNOSIS — E7800 Pure hypercholesterolemia, unspecified: Secondary | ICD-10-CM | POA: Diagnosis not present

## 2023-12-11 MED ORDER — ASPIRIN 81 MG PO TBEC: 81.0000 mg | DELAYED_RELEASE_TABLET | Freq: Every day | ORAL | Status: AC

## 2023-12-11 MED ORDER — TRELEGY ELLIPTA 100-62.5-25 MCG/ACT IN AEPB: 1.0000 | INHALATION_SPRAY | Freq: Every day | RESPIRATORY_TRACT | 5 refills | Status: AC

## 2023-12-11 NOTE — Assessment & Plan Note (Signed)
 C-Met and lipid profile today Encouraged her to consume a low-fat diet Discussed LDL goal <70, will adjust rosuvastatin if needed based on labs

## 2023-12-11 NOTE — Patient Instructions (Signed)
 Health Maintenance for Postmenopausal Women Menopause is a normal process in which your ability to get pregnant comes to an end. This process happens slowly over many months or years, usually between the ages of 24 and 62. Menopause is complete when you have missed your menstrual period for 12 months. It is important to talk with your health care provider about some of the most common conditions that affect women after menopause (postmenopausal women). These include heart disease, cancer, and bone loss (osteoporosis). Adopting a healthy lifestyle and getting preventive care can help to promote your health and wellness. The actions you take can also lower your chances of developing some of these common conditions. What are the signs and symptoms of menopause? During menopause, you may have the following symptoms: Hot flashes. These can be moderate or severe. Night sweats. Decrease in sex drive. Mood swings. Headaches. Tiredness (fatigue). Irritability. Memory problems. Problems falling asleep or staying asleep. Talk with your health care provider about treatment options for your symptoms. Do I need hormone replacement therapy? Hormone replacement therapy is effective in treating symptoms that are caused by menopause, such as hot flashes and night sweats. Hormone replacement carries certain risks, especially as you become older. If you are thinking about using estrogen or estrogen with progestin, discuss the benefits and risks with your health care provider. How can I reduce my risk for heart disease and stroke? The risk of heart disease, heart attack, and stroke increases as you age. One of the causes may be a change in the body's hormones during menopause. This can affect how your body uses dietary fats, triglycerides, and cholesterol. Heart attack and stroke are medical emergencies. There are many things that you can do to help prevent heart disease and stroke. Watch your blood pressure High  blood pressure causes heart disease and increases the risk of stroke. This is more likely to develop in people who have high blood pressure readings or are overweight. Have your blood pressure checked: Every 3-5 years if you are 50-75 years of age. Every year if you are 77 years old or older. Eat a healthy diet  Eat a diet that includes plenty of vegetables, fruits, low-fat dairy products, and lean protein. Do not eat a lot of foods that are high in solid fats, added sugars, or sodium. Get regular exercise Get regular exercise. This is one of the most important things you can do for your health. Most adults should: Try to exercise for at least 150 minutes each week. The exercise should increase your heart rate and make you sweat (moderate-intensity exercise). Try to do strengthening exercises at least twice each week. Do these in addition to the moderate-intensity exercise. Spend less time sitting. Even light physical activity can be beneficial. Other tips Work with your health care provider to achieve or maintain a healthy weight. Do not use any products that contain nicotine or tobacco. These products include cigarettes, chewing tobacco, and vaping devices, such as e-cigarettes. If you need help quitting, ask your health care provider. Know your numbers. Ask your health care provider to check your cholesterol and your blood sugar (glucose). Continue to have your blood tested as directed by your health care provider. Do I need screening for cancer? Depending on your health history and family history, you may need to have cancer screenings at different stages of your life. This may include screening for: Breast cancer. Cervical cancer. Lung cancer. Colorectal cancer. What is my risk for osteoporosis? After menopause, you may be  at increased risk for osteoporosis. Osteoporosis is a condition in which bone destruction happens more quickly than new bone creation. To help prevent osteoporosis or  the bone fractures that can happen because of osteoporosis, you may take the following actions: If you are 61-3 years old, get at least 1,000 mg of calcium and at least 600 international units (IU) of vitamin D per day. If you are older than age 61 but younger than age 75, get at least 1,200 mg of calcium and at least 600 international units (IU) of vitamin D per day. If you are older than age 62, get at least 1,200 mg of calcium and at least 800 international units (IU) of vitamin D per day. Smoking and drinking excessive alcohol increase the risk of osteoporosis. Eat foods that are rich in calcium and vitamin D, and do weight-bearing exercises several times each week as directed by your health care provider. How does menopause affect my mental health? Depression may occur at any age, but it is more common as you become older. Common symptoms of depression include: Feeling depressed. Changes in sleep patterns. Changes in appetite or eating patterns. Feeling an overall lack of motivation or enjoyment of activities that you previously enjoyed. Frequent crying spells. Talk with your health care provider if you think that you are experiencing any of these symptoms. General instructions See your health care provider for regular wellness exams and vaccines. This may include: Scheduling regular health, dental, and eye exams. Getting and maintaining your vaccines. These include: Influenza vaccine. Get this vaccine each year before the flu season begins. Pneumonia vaccine. Shingles vaccine. Tetanus, diphtheria, and pertussis (Tdap) booster vaccine. Your health care provider may also recommend other immunizations. Tell your health care provider if you have ever been abused or do not feel safe at home. Summary Menopause is a normal process in which your ability to get pregnant comes to an end. This condition causes hot flashes, night sweats, decreased interest in sex, mood swings, headaches, or lack  of sleep. Treatment for this condition may include hormone replacement therapy. Take actions to keep yourself healthy, including exercising regularly, eating a healthy diet, watching your weight, and checking your blood pressure and blood sugar levels. Get screened for cancer and depression. Make sure that you are up to date with all your vaccines. This information is not intended to replace advice given to you by your health care provider. Make sure you discuss any questions you have with your health care provider. Document Revised: 01/28/2021 Document Reviewed: 01/28/2021 Elsevier Patient Education  2024 [employer].

## 2023-12-11 NOTE — Assessment & Plan Note (Signed)
 Will start Trelegy Encourage smoking cessation

## 2023-12-11 NOTE — Progress Notes (Signed)
 Subjective:    Patient ID: Diana Bruce, female    DOB: 1965-12-06, 58 y.o.   MRN: [id number]  HPI  Patient presents to clinic today for her annual.  She is also due to follow-up chronic conditions.  HLD with aortic atherosclerosis: Her last LDL was 98, triglycerides 387, 11/2022.  She denies myalgias on rosuvastatin.  She is not taking any aspirin at this time.  She tries to consume low-fat diet.  COPD: She has developed a cough but does not have a chronic cough or shortness of breath.  She is not using any inhalers at this time.  She does continue to smoke.  OA: Mainly in her hands and knees.  She takes naproxen as needed with some relief of symptoms.  She does not follow with orthopedics.  Flu: 07/2014 Tetanus: 04/2019 COVID: Never Shingrix: 11/2019, 02/2020 Pap smear: 04/2019 Mammogram: 12/2022 Colon screening: 02/2022 Vision screening: annually Dentist: as needed, dentures  Diet: She does eat meat. She consumes fruits and veggies. She does eat some fried foods. She drinks mostly soda and coffee. Exercise: None  Review of Systems  Past Medical History:  Diagnosis Date   Chicken pox     Current Outpatient Medications  Medication Sig Dispense Refill   Naproxen Sodium (ALEVE PO) Take 1 tablet by mouth daily.     rosuvastatin (CRESTOR) 10 MG tablet Take 1 tablet (10 mg total) by mouth daily. 90 tablet 3   No current facility-administered medications for this visit.    Allergies  Allergen Reactions   Codeine Nausea And Vomiting    Family History  Problem Relation Age of Onset   Alcohol abuse Father    Stroke Father    Heart disease Father    Cancer Mother        bladder   Heart disease Maternal Uncle    Heart disease Maternal Grandfather    Diabetes Neg Hx     Social History   Socioeconomic History   Marital status: Married    Spouse name: Not on file   Number of children: Not on file   Years of education: Not on file   Highest education level: Not on file   Occupational History   Not on file  Tobacco Use   Smoking status: Every Day    Current packs/day: 1.00    Average packs/day: 1 pack/day for 25.0 years (25.0 ttl pk-yrs)    Types: Cigarettes   Smokeless tobacco: Never  Vaping Use   Vaping status: Former  Substance and Sexual Activity   Alcohol use: Yes    Alcohol/week: 1.0 standard drink of alcohol    Types: 1 Glasses of wine per week    Comment: rare--wine   Drug use: No   Sexual activity: Not Currently  Other Topics Concern   Not on file  Social History Narrative   Not on file   Social Drivers of Health   Financial Resource Strain: Not on file  Food Insecurity: Not on file  Transportation Needs: Not on file  Physical Activity: Not on file  Stress: Not on file  Social Connections: Not on file  Intimate Partner Violence: Not on file     Constitutional: Denies fever, malaise, fatigue, headache or abrupt weight changes.  HEENT: Denies eye pain, eye redness, ear pain, ringing in the ears, wax buildup, runny nose, nasal congestion, bloody nose, or sore throat. Respiratory: Pt reports cough. Denies difficulty breathing, shortness of breath, cough or sputum production.   Cardiovascular: Denies  chest pain, chest tightness, palpitations or swelling in the hands or feet.  Gastrointestinal: Denies abdominal pain, bloating, constipation, diarrhea or blood in the stool.  GU: Denies urgency, frequency, pain with urination, burning sensation, blood in urine, odor or discharge. Musculoskeletal: Patient reports joint pain.  Denies decrease in range of motion, difficulty with gait, muscle pain or joint swelling.  Skin: Denies redness, rashes, lesions or ulcercations.  Neurological: Denies dizziness, difficulty with memory, difficulty with speech or problems with balance and coordination.  Psych: Denies anxiety, depression, SI/HI.  No other specific complaints in a complete review of systems (except as listed in HPI above).      Objective:   Physical Exam  BP 130/72 (BP Location: Left Arm, Patient Position: Sitting, Cuff Size: Normal)   Pulse 92   Ht 5' 2.75" (1.594 m)   Wt 129 lb 9.6 oz (58.8 kg)   SpO2 97%   BMI 23.14 kg/m    Wt Readings from Last 3 Encounters:  12/09/22 131 lb (59.4 kg)  02/25/22 128 lb (58.1 kg)  12/31/21 132 lb (59.9 kg)    General: Appears her stated age, well developed, well nourished in NAD. Skin: Warm, dry and intact. No rashes noted. HEENT: Head: normal shape and size; Eyes: sclera white, no icterus, conjunctiva pink, PERRLA and EOMs intact;  Neck:  Neck supple, trachea midline. No masses, lumps or thyromegaly present.  Cardiovascular: Normal rate and rhythm. S1,S2 noted.  No murmur, rubs or gallops noted. No JVD or BLE edema. No carotid bruits noted. Pulmonary/Chest: Normal effort and positive vesicular breath sounds. No respiratory distress. No wheezes, rales or ronchi noted.  Abdomen: Normal bowel sounds.  Musculoskeletal: Strength 5/5 BUE/BLE. No difficulty with gait.  Neurological: Alert and oriented. Cranial nerves II-XII grossly intact. Coordination normal.  Psychiatric: Mood and affect normal. Behavior is normal. Judgment and thought content normal.    BMET    Component Value Date/Time   NA 142 12/09/2022 1323   K 4.5 12/09/2022 1323   CL 107 12/09/2022 1323   CO2 29 12/09/2022 1323   GLUCOSE 90 12/09/2022 1323   BUN 13 12/09/2022 1323   CREATININE 0.79 12/09/2022 1323   CALCIUM 9.7 12/09/2022 1323   GFRNONAA >60 03/21/2022 2213   GFRAA  07/11/2009 1045    >60        The eGFR has been calculated using the MDRD equation. This calculation has not been validated in all clinical situations. eGFR's persistently <60 mL/min signify possible Chronic Kidney Disease.    Lipid Panel     Component Value Date/Time   CHOL 178 12/09/2022 1323   TRIG 127 12/09/2022 1323   HDL 58 12/09/2022 1323   CHOLHDL 3.1 12/09/2022 1323   VLDL 24.0 08/03/2019 1314    LDLCALC 98 12/09/2022 1323    CBC    Component Value Date/Time   WBC 9.2 12/09/2022 1323   RBC 4.65 12/09/2022 1323   HGB 13.9 12/09/2022 1323   HCT 41.7 12/09/2022 1323   PLT 266 12/09/2022 1323   MCV 89.7 12/09/2022 1323   MCH 29.9 12/09/2022 1323   MCHC 33.3 12/09/2022 1323   RDW 11.9 12/09/2022 1323   LYMPHSABS 2.5 07/11/2009 1045   MONOABS 0.6 07/11/2009 1045   EOSABS 0.1 07/11/2009 1045   BASOSABS 0.1 07/11/2009 1045    Hgb A1C Lab Results  Component Value Date   HGBA1C 5.2 05/17/2021            Assessment & Plan:   Preventative Health  Maintenance:  Encouraged her to get a flu shot in the fall Tetanus UTD Encouraged her to get her COVID-vaccine Shingrix UTD Will give her Prevnar 20 today Pap smear declined today CT Lung cancer screening ordered by pulmonology Mammogram ordered-she will call to schedule Colon screening UTD Encouraged her to consume a balanced diet and exercise regimen Advised her to see an eye doctor and dentist annually We will check CBC, c-Met, lipid profile and A1c today  RTC in 6 months followup chronic condition Nicki Reaper, NP

## 2023-12-11 NOTE — Assessment & Plan Note (Signed)
 C-Met and lipid profile today Encouraged her to consume a low-fat diet Discussed LDL goal less than 70, will adjust rosuvastatin if needed based on labs Advised her to start a baby aspirin

## 2023-12-11 NOTE — Assessment & Plan Note (Signed)
Continue naproxen as needed 

## 2023-12-12 LAB — CBC
HCT: 42.6 % (ref 35.0–45.0)
Hemoglobin: 14.2 g/dL (ref 11.7–15.5)
MCH: 30.3 pg (ref 27.0–33.0)
MCHC: 33.3 g/dL (ref 32.0–36.0)
MCV: 90.8 fL (ref 80.0–100.0)
MPV: 10.5 fL (ref 7.5–12.5)
Platelets: 284 10*3/uL (ref 140–400)
RBC: 4.69 10*6/uL (ref 3.80–5.10)
RDW: 12.3 % (ref 11.0–15.0)
WBC: 12.8 10*3/uL — ABNORMAL HIGH (ref 3.8–10.8)

## 2023-12-12 LAB — COMPLETE METABOLIC PANEL WITH GFR
AG Ratio: 1.7 (calc) (ref 1.0–2.5)
ALT: 23 U/L (ref 6–29)
AST: 25 U/L (ref 10–35)
Albumin: 4.5 g/dL (ref 3.6–5.1)
Alkaline phosphatase (APISO): 105 U/L (ref 37–153)
BUN: 8 mg/dL (ref 7–25)
CO2: 28 mmol/L (ref 20–32)
Calcium: 9.6 mg/dL (ref 8.6–10.4)
Chloride: 104 mmol/L (ref 98–110)
Creat: 0.86 mg/dL (ref 0.50–1.03)
Globulin: 2.7 g/dL (ref 1.9–3.7)
Glucose, Bld: 83 mg/dL (ref 65–99)
Potassium: 4.3 mmol/L (ref 3.5–5.3)
Sodium: 141 mmol/L (ref 135–146)
Total Bilirubin: 0.6 mg/dL (ref 0.2–1.2)
Total Protein: 7.2 g/dL (ref 6.1–8.1)

## 2023-12-12 LAB — LIPID PANEL
Cholesterol: 197 mg/dL (ref <200)
HDL: 62 mg/dL (ref >=50)
LDL Cholesterol (Calc): 114 mg/dL — ABNORMAL HIGH
Non-HDL Cholesterol (Calc): 135 mg/dL — ABNORMAL HIGH (ref <130)
Total CHOL/HDL Ratio: 3.2 (calc) (ref <5.0)
Triglycerides: 104 mg/dL (ref <150)

## 2023-12-12 LAB — HEMOGLOBIN A1C
Hgb A1c MFr Bld: 5.5 %{Hb} (ref <5.7)
Mean Plasma Glucose: 111 mg/dL
eAG (mmol/L): 6.2 mmol/L

## 2023-12-14 ENCOUNTER — Encounter: Payer: Self-pay | Admitting: Internal Medicine

## 2023-12-15 MED ORDER — ROSUVASTATIN CALCIUM 20 MG PO TABS: 20.0000 mg | ORAL_TABLET | Freq: Every day | ORAL | 1 refills | Status: AC

## 2024-03-06 ENCOUNTER — Other Ambulatory Visit: Payer: Self-pay | Admitting: Internal Medicine

## 2024-03-08 NOTE — Telephone Encounter (Signed)
 Unable to refill per protocol, Rx expired. Discontinued 12/15/23, dose change.  Requested Prescriptions  Pending Prescriptions Disp Refills   rosuvastatin  (CRESTOR ) 10 MG tablet [Pharmacy Med Name: ROSUVASTATIN  CALCIUM  10 MG TAB] 90 tablet 3    Sig: TAKE 1 TABLET BY MOUTH EVERY DAY     Cardiovascular:  Antilipid - Statins 2 Failed - 03/08/2024  3:41 PM      Failed - Lipid Panel in normal range within the last 12 months    Cholesterol  Date Value Ref Range Status  12/11/2023 197 <200 mg/dL Final   LDL Cholesterol (Calc)  Date Value Ref Range Status  12/11/2023 114 (H) mg/dL (calc) Final    Comment:    Reference range: <100 . Desirable range <100 mg/dL for primary prevention;   <70 mg/dL for patients with CHD or diabetic patients  with > or = 2 CHD risk factors. Aaron Aas LDL-C is now calculated using the Martin-Hopkins  calculation, which is a validated novel method providing  better accuracy than the Friedewald equation in the  estimation of LDL-C.  Melinda Sprawls et al. Erroll Heard. 1610;960(45): 2061-2068  (http://education.[employer].com/faq/FAQ164)    HDL  Date Value Ref Range Status  12/11/2023 62 > OR = 50 mg/dL Final   Triglycerides  Date Value Ref Range Status  12/11/2023 104 <150 mg/dL Final         Passed - Cr in normal range and within 360 days    Creat  Date Value Ref Range Status  12/11/2023 0.86 0.50 - 1.03 mg/dL Final         Passed - Patient is not pregnant      Passed - Valid encounter within last 12 months    Recent Outpatient Visits           2 months ago Encounter for general adult medical examination with abnormal findings   [facility] [place], Rankin Buzzard, NP

## 2024-03-30 ENCOUNTER — Telehealth: Payer: Self-pay | Admitting: *Deleted

## 2024-03-30 NOTE — Telephone Encounter (Signed)
 Left voicemail for patient to call to schedule yearly lung screening CT.

## 2024-07-26 ENCOUNTER — Telehealth: Admitting: Physician Assistant

## 2024-07-26 DIAGNOSIS — M545 Low back pain, unspecified: Secondary | ICD-10-CM | POA: Diagnosis not present

## 2024-07-26 MED ORDER — NAPROXEN 500 MG PO TABS: 500.0000 mg | ORAL_TABLET | Freq: Two times a day (BID) | ORAL | 0 refills | Status: AC

## 2024-07-26 MED ORDER — CYCLOBENZAPRINE HCL 10 MG PO TABS: 10.0000 mg | ORAL_TABLET | Freq: Three times a day (TID) | ORAL | 0 refills | Status: AC | PRN

## 2024-07-26 NOTE — Progress Notes (Signed)
